# Patient Record
Sex: Male | Born: 1995 | Hispanic: No | Marital: Single | Smoking: Never smoker
Health system: Southern US, Community
[De-identification: ages and names within clinical notes are randomized; demographics above are authoritative.]

## PROBLEM LIST (undated history)

## (undated) DIAGNOSIS — F840 Autistic disorder: Secondary | ICD-10-CM

---
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
## 2020-10-12 ENCOUNTER — Other Ambulatory Visit: Payer: Self-pay

## 2020-10-12 ENCOUNTER — Encounter (HOSPITAL_COMMUNITY): Payer: Self-pay | Admitting: Emergency Medicine

## 2020-10-12 ENCOUNTER — Emergency Department (HOSPITAL_COMMUNITY)
Admission: EM | Admit: 2020-10-12 | Discharge: 2020-10-12 | Disposition: A | Discharge status: Home / Self Care | Payer: Medicaid Other | Attending: Emergency Medicine | Admitting: Emergency Medicine

## 2020-10-12 DIAGNOSIS — R0602 Shortness of breath: Secondary | ICD-10-CM | POA: Insufficient documentation

## 2020-10-12 DIAGNOSIS — F840 Autistic disorder: Secondary | ICD-10-CM | POA: Diagnosis not present

## 2020-10-12 DIAGNOSIS — T782XXA Anaphylactic shock, unspecified, initial encounter: Secondary | ICD-10-CM

## 2020-10-12 DIAGNOSIS — R000 Tachycardia, unspecified: Secondary | ICD-10-CM | POA: Diagnosis not present

## 2020-10-12 DIAGNOSIS — Z20822 Contact with and (suspected) exposure to covid-19: Secondary | ICD-10-CM | POA: Insufficient documentation

## 2020-10-12 HISTORY — DX: Autistic disorder: F84.0

## 2020-10-12 LAB — BASIC METABOLIC PANEL
Anion gap: 10 (ref 5–15)
BUN: 16 mg/dL (ref 6–20)
CO2: 22 mmol/L (ref 22–32)
Calcium: 9.5 mg/dL (ref 8.9–10.3)
Chloride: 106 mmol/L (ref 98–111)
Creatinine, Ser: 0.97 mg/dL (ref 0.61–1.24)
GFR, Estimated: >60 mL/min (ref >60)
Glucose, Bld: 119 mg/dL — ABNORMAL HIGH (ref 70–99)
Potassium: 3.6 mmol/L (ref 3.5–5.1)
Sodium: 138 mmol/L (ref 135–145)

## 2020-10-12 LAB — CBG MONITORING, ED: Glucose-Capillary: 129 mg/dL — ABNORMAL HIGH (ref 70–99)

## 2020-10-12 LAB — CBC WITH DIFFERENTIAL/PLATELET
Abs Immature Granulocytes: 0.14 10*3/uL — ABNORMAL HIGH (ref 0.00–0.07)
Basophils Absolute: 0.1 10*3/uL (ref 0.0–0.1)
Basophils Relative: 0 %
Eosinophils Absolute: 0 10*3/uL (ref 0.0–0.5)
Eosinophils Relative: 0 %
HCT: 50.6 % (ref 39.0–52.0)
Hemoglobin: 16.6 g/dL (ref 13.0–17.0)
Immature Granulocytes: 1 %
Lymphocytes Relative: 15 %
Lymphs Abs: 3.5 10*3/uL (ref 0.7–4.0)
MCH: 29.2 pg (ref 26.0–34.0)
MCHC: 32.8 g/dL (ref 30.0–36.0)
MCV: 89.1 fL (ref 80.0–100.0)
Monocytes Absolute: 1.4 10*3/uL — ABNORMAL HIGH (ref 0.1–1.0)
Monocytes Relative: 6 %
Neutro Abs: 18 10*3/uL — ABNORMAL HIGH (ref 1.7–7.7)
Neutrophils Relative %: 78 %
Platelets: 356 10*3/uL (ref 150–400)
RBC: 5.68 MIL/uL (ref 4.22–5.81)
RDW: 13.1 % (ref 11.5–15.5)
WBC: 23.1 10*3/uL — ABNORMAL HIGH (ref 4.0–10.5)
nRBC: 0 % (ref 0.0–0.2)

## 2020-10-12 LAB — I-STAT CHEM 8, ED
BUN: 19 mg/dL (ref 6–20)
Calcium, Ion: 1.21 mmol/L (ref 1.15–1.40)
Chloride: 105 mmol/L (ref 98–111)
Creatinine, Ser: 0.8 mg/dL (ref 0.61–1.24)
Glucose, Bld: 118 mg/dL — ABNORMAL HIGH (ref 70–99)
HCT: 52 % (ref 39.0–52.0)
Hemoglobin: 17.7 g/dL — ABNORMAL HIGH (ref 13.0–17.0)
Potassium: 3.5 mmol/L (ref 3.5–5.1)
Sodium: 141 mmol/L (ref 135–145)
TCO2: 25 mmol/L (ref 22–32)

## 2020-10-12 LAB — RESPIRATORY PANEL BY RT PCR (FLU A&B, COVID)
Influenza A by PCR: NEGATIVE
Influenza B by PCR: NEGATIVE
SARS Coronavirus 2 by RT PCR: NEGATIVE

## 2020-10-12 MED ORDER — EPINEPHRINE 0.3 MG/0.3ML IJ SOAJ: 0.3000 mg | INTRAMUSCULAR | 1 refills | Status: DC | PRN

## 2020-10-12 MED ORDER — FAMOTIDINE 20 MG PO TABS: 20.0000 mg | ORAL_TABLET | Freq: Two times a day (BID) | ORAL | 0 refills | Status: DC

## 2020-10-12 MED ORDER — LACTATED RINGERS IV BOLUS
1000.0000 mL | Freq: Once | INTRAVENOUS | Status: AC
  Administered 2020-10-12: 1000 mL via INTRAVENOUS

## 2020-10-12 MED ORDER — EPINEPHRINE 0.3 MG/0.3ML IJ SOAJ: 0.3000 mg | Freq: Once | INTRAMUSCULAR | Status: AC

## 2020-10-12 MED ORDER — DEXAMETHASONE SODIUM PHOSPHATE 10 MG/ML IJ SOLN
10.0000 mg | Freq: Once | INTRAMUSCULAR | Status: AC
  Administered 2020-10-12: 10 mg via INTRAVENOUS
  Filled 2020-10-12: qty 1

## 2020-10-12 MED ORDER — EPINEPHRINE 0.3 MG/0.3ML IJ SOAJ
INTRAMUSCULAR | Status: AC
  Administered 2020-10-12: 0.3 mg via INTRAMUSCULAR
  Filled 2020-10-12: qty 0.3

## 2020-10-12 MED ORDER — ONDANSETRON HCL 4 MG/2ML IJ SOLN
4.0000 mg | Freq: Once | INTRAMUSCULAR | Status: AC
  Administered 2020-10-12: 4 mg via INTRAVENOUS
  Filled 2020-10-12: qty 2

## 2020-10-12 MED ORDER — DIPHENHYDRAMINE HCL 50 MG/ML IJ SOLN
25.0000 mg | Freq: Once | INTRAMUSCULAR | Status: AC
  Administered 2020-10-12: 25 mg via INTRAVENOUS
  Filled 2020-10-12: qty 1

## 2020-10-12 MED ORDER — DIPHENHYDRAMINE HCL 25 MG PO TABS: 25.0000 mg | ORAL_TABLET | Freq: Four times a day (QID) | ORAL | 0 refills | Status: DC | PRN

## 2020-10-12 MED ORDER — PREDNISONE 10 MG (21) PO TBPK: ORAL_TABLET | ORAL | 0 refills | Status: DC

## 2020-10-12 MED ORDER — FAMOTIDINE IN NACL 20-0.9 MG/50ML-% IV SOLN
20.0000 mg | Freq: Once | INTRAVENOUS | Status: AC
  Administered 2020-10-12: 20 mg via INTRAVENOUS
  Filled 2020-10-12: qty 50

## 2020-10-12 NOTE — ED Triage Notes (Addendum)
Pt has autism.  Mother reports he was outside mowing and came in vomiting profusely and had a syncopal episode.  Pt has hives all over.  Questioned if he got stung while mowing and he states he was stung on R ankle.  Speaking in complete sentences.  States he does feel SOB.  No angioedema noted.

## 2020-10-12 NOTE — ED Provider Notes (Signed)
[facility] Provider Note   CSN: [id number] Arrival date & time: 10/12/20  1415     History Chief Complaint  Patient presents with  . Anaphalaxis    [facility] is a 24 y.o. male.  HPI      [facility] is a 24 y.o. male, with a history of Autism and obesity, presenting to the ED accompanied by his mother with hives, vomiting, shortness of breath beginning suddenly around 1 PM. Mother states patient was outside mowing the yard, came inside vomiting, had a syncopal episode.  When he awoke, he complained of shortness of breath and she noted a rash on his body. Patient states he still feels some shortness of breath.  He has not vomited since the initial episode of emesis. He states he was stung by a bee on his right ankle.  Patient and his mother deny any previous known reaction to stings. Patient denies current dizziness, headache, chest pain, abdominal pain, facial swelling, mouth swelling, or any other complaints.  Past Medical History:  Diagnosis Date  . Autism     There are no problems to display for this patient.   History reviewed. No pertinent surgical history.     No family history on file.  Social History   Tobacco Use  . Smoking status: Never Smoker  . Smokeless tobacco: Never Used  Substance Use Topics  . Alcohol use: Never  . Drug use: Never    Home Medications Prior to Admission medications   Medication Sig Start Date End Date Taking? Authorizing Provider  diphenhydrAMINE (BENADRYL) 25 MG tablet Take 1 tablet (25 mg total) by mouth every 6 (six) hours as needed for up to 1 day. 10/12/20 10/13/20  Yamaira Spinner C, PA-C  EPINEPHrine 0.3 mg/0.3 mL IJ SOAJ injection Inject 0.3 mg into the muscle as needed for anaphylaxis. 10/12/20   Salihah Peckham C, PA-C  famotidine (PEPCID) 20 MG tablet Take 1 tablet (20 mg total) by mouth 2 (two) times daily for 3 days. 10/12/20 10/15/20  Lanaya Bennis C, PA-C  predniSONE (STERAPRED  UNI-PAK 21 TAB) 10 MG (21) TBPK tablet Take 6 tabs (60mg ) day 1, 5 tabs (50mg ) day 2, 4 tabs (40mg ) day 3, 3 tabs (30mg ) day 4, 2 tabs (20mg ) day 5, and 1 tab (10mg ) day 6. 10/12/20   Queenie Aufiero C, PA-C    Allergies    Bee venom  Review of Systems   Review of Systems  Constitutional: Positive for diaphoresis.  HENT: Negative for drooling, facial swelling and trouble swallowing.   Respiratory: Positive for shortness of breath.   Cardiovascular: Negative for chest pain.  Gastrointestinal: Positive for nausea and vomiting. Negative for abdominal pain and diarrhea.  Skin: Positive for rash.  Neurological: Positive for syncope.  All other systems reviewed and are negative.   Physical Exam Updated Vital Signs BP (!) 107/91 (BP Location: Right Arm)   Pulse 80   Temp 97.8 F (36.6 C) (Oral)   Resp 15   SpO2 99%   Physical Exam Vitals and nursing note reviewed.  Constitutional:      General: He is not in acute distress.    Appearance: He is well-developed. He is not diaphoretic.  HENT:     Head: Normocephalic and atraumatic.     Mouth/Throat:     Mouth: Mucous membranes are moist.     Pharynx: Oropharynx is clear.     Comments: No noted facial, perioral, or intraoral swelling or  lesions. No phonation abnormality.  Handles oral secretions without difficulty. Eyes:     Conjunctiva/sclera: Conjunctivae normal.  Cardiovascular:     Rate and Rhythm: Regular rhythm. Tachycardia present.     Pulses: Normal pulses.          Radial pulses are 2+ on the right side and 2+ on the left side.       Posterior tibial pulses are 2+ on the right side and 2+ on the left side.     Heart sounds: Normal heart sounds.     Comments: Tactile temperature in the extremities appropriate and equal bilaterally. Pulmonary:     Effort: Pulmonary effort is normal. No respiratory distress.     Breath sounds: Normal breath sounds.  Abdominal:     Palpations: Abdomen is soft.     Tenderness: There is no  abdominal tenderness. There is no guarding.  Musculoskeletal:     Cervical back: Neck supple.     Right lower leg: No edema.     Left lower leg: No edema.  Lymphadenopathy:     Cervical: No cervical adenopathy.  Skin:    General: Skin is warm and dry.     Findings: Rash present. Rash is urticarial.  Neurological:     Mental Status: He is alert.  Psychiatric:        Mood and Affect: Mood and affect normal.        Speech: Speech normal.        Behavior: Behavior normal.     ED Results / Procedures / Treatments   Labs (all labs ordered are listed, but only abnormal results are displayed) Labs Reviewed  CBC WITH DIFFERENTIAL/PLATELET - Abnormal; Notable for the following components:      Result Value   WBC 23.1 (*)    Neutro Abs 18.0 (*)    Monocytes Absolute 1.4 (*)    Abs Immature Granulocytes 0.14 (*)    All other components within normal limits  BASIC METABOLIC PANEL - Abnormal; Notable for the following components:   Glucose, Bld 119 (*)    All other components within normal limits  CBG MONITORING, ED - Abnormal; Notable for the following components:   Glucose-Capillary 129 (*)    All other components within normal limits  I-STAT CHEM 8, ED - Abnormal; Notable for the following components:   Glucose, Bld 118 (*)    Hemoglobin 17.7 (*)    All other components within normal limits  RESPIRATORY PANEL BY RT PCR (FLU A&B, COVID)    EKG None  Radiology No results found.  Procedures .Critical Care Performed by: Anselm Pancoast, PA-C Authorized by: Anselm Pancoast, PA-C   Critical care provider statement:    Critical care time (minutes):  35   Critical care time was exclusive of:  Separately billable procedures and treating other patients   Critical care was necessary to treat or prevent imminent or life-threatening deterioration of the following conditions: Anaphylactic reaction.   Critical care was time spent personally by me on the following activities:  Ordering and  performing treatments and interventions, ordering and review of laboratory studies, ordering and review of radiographic studies, re-evaluation of patient's condition, obtaining history from patient or surrogate, evaluation of patient's response to treatment, development of treatment plan with patient or surrogate and examination of patient   I assumed direction of critical care for this patient from another provider in my specialty: no     (including critical care time)  Medications Ordered in  ED Medications  diphenhydrAMINE (BENADRYL) injection 25 mg (25 mg Intravenous Given 10/12/20 1603)  EPINEPHrine (EPI-PEN) injection 0.3 mg (0.3 mg Intramuscular Given 10/12/20 1555)  dexamethasone (DECADRON) injection 10 mg (10 mg Intravenous Given 10/12/20 1608)  famotidine (PEPCID) IVPB 20 mg premix (0 mg Intravenous Stopped 10/12/20 1630)  ondansetron (ZOFRAN) injection 4 mg (4 mg Intravenous Given 10/12/20 1606)  lactated ringers bolus 1,000 mL (0 mLs Intravenous Stopped 10/12/20 1727)    ED Course  I have reviewed the triage vital signs and the nursing notes.  Pertinent labs & imaging results that were available during my care of the patient were reviewed by me and considered in my medical decision making (see chart for details).  Clinical Course as of Oct 13 1847  Sat Oct 12, 2020  1552 Epi   [SJ]  1602 Patient reassessed.  States his shortness of breath has improved.  Lung sounds remain clear.  Hives are also beginning to clear.  No longer nauseous or vomiting.   [SJ]  1645 Patient again reassessed.  Denies any recurrence of his symptoms.   [SJ]    Clinical Course User Index [SJ] TRUE Shackleford, Hillard Danker, PA-C   MDM Rules/Calculators/A&P                          Patient presents with evidence of allergic reaction.   Patient initially evaluated in triage/waiting room.  Noted to have hives at that time, but no other symptom involvement was noted.  However, patient began to vomit when he came back  to a room.  Treatment for anaphylaxis was initiated.  No evidence of hypotension or altered mental status. Patient symptoms resolved during ED course without recurrence. He continued to be symptom-free upon my evaluation just prior to discharge. I personally reviewed and interpreted the patient's lab work.  Patient and his mother were given instructions for home care as well as return precautions.  Both parties voice understanding of these instructions, accept the plan, and are comfortable with discharge.  Findings and plan of care discussed with Adalberto Cole, MD. Dr. Jeraldine Loots personally evaluated and examined this patient.  Vitals:   10/12/20 1745 10/12/20 1800 10/12/20 1815 10/12/20 1848  BP: 117/65 123/67 107/69 118/74  Pulse: 96 92 100 91  Resp: (!) 31 (!) 32 (!) 22 20  Temp:      TempSrc:      SpO2: 93% 94% 96% 96%     Final Clinical Impression(s) / ED Diagnoses Final diagnoses:  Anaphylaxis, initial encounter    Rx / DC Orders ED Discharge Orders         Ordered    diphenhydrAMINE (BENADRYL) 25 MG tablet  Every 6 hours PRN        10/12/20 1845    famotidine (PEPCID) 20 MG tablet  2 times daily        10/12/20 1845    EPINEPHrine 0.3 mg/0.3 mL IJ SOAJ injection  As needed        10/12/20 1845    predniSONE (STERAPRED UNI-PAK 21 TAB) 10 MG (21) TBPK tablet        10/12/20 1845           Anselm Pancoast, PA-C 10/12/20 1850    Gerhard Munch, MD 10/13/20 2317

## 2020-10-12 NOTE — ED Notes (Signed)
Beverage and snack given to patient.

## 2020-10-12 NOTE — Discharge Instructions (Addendum)
Allergic Reaction Instructions:  Benadryl: Take 25 mg of Benadryl every 6 hours for the next 24 hours.  Use caution as Benadryl can make you drowsy. Pepcid: Take the Pepcid, as prescribed, over the next 3 days. Prednisone: Take prednisone, as prescribed, until finished. EpiPen: You have been prescribed an EpiPen to be used in the case of anaphylaxis. Please see the attached sheets regarding the symptoms that would cause you to have to use the EpiPen. If you have to use the EpiPen, you should always come to the emergency room immediately.  Follow-up with your primary care provider on this matter.  Allergy testing with an allergist may be warranted.  Return to the ED for worsening symptoms, shortness of breath, chest pain, palpitations, persistent vomiting, facial or throat swelling, or any other major concerns.  For prescription assistance, may try using prescription discount sites or apps, such as goodrx.com or Good Rx smart phone app. 

## 2021-11-02 NOTE — Progress Notes (Signed)
Virtual Visit via Telephone Note  I connected with [facility], on 11/05/2021 at 3:21 PM by telephone due to the COVID-19 pandemic and verified that I am speaking with the correct person using two identifiers.  Due to current restrictions/limitations of in-office visits due to the COVID-19 pandemic, this scheduled clinical appointment was converted to a telehealth visit.   Consent: I discussed the limitations, risks, security and privacy concerns of performing an evaluation and management service by telephone and the availability of in person appointments. I also discussed with the patient that there may be a patient responsible charge related to this service. The patient expressed understanding and agreed to proceed.   Location of Patient: Home  Location of Provider: [facility]   Persons participating in Telemedicine visit: Suella Grove, NP Margorie John, CMA   History of Present Illness: [facility] is a 25 year-old male who presents to establish care. He is accompanied by his mother, [name], who serves as primary historian.  Current issues and/or concerns: Would like annual physical exam soon. Reports intermittent lower back pain for months. Taking Ibuprofen helps. Denies any recent trauma/injury.    Past Medical History:  Diagnosis Date   Autism    Allergies  Allergen Reactions   Bee Venom Anaphylaxis    No current outpatient medications on file prior to visit.   No current facility-administered medications on file prior to visit.    Observations/Objective: Alert and oriented x 3. Not in acute distress. Physical examination not completed as this is a telemedicine visit.  Assessment and Plan: 1. Encounter to establish care: - Patient presents today to establish care.  - Return for annual physical examination, labs, and health maintenance. Arrive fasting meaning having no food for at least 8 hours prior to  appointment. You may have only water or black coffee. Please take scheduled medications as normal.  2. Chronic bilateral low back pain, unspecified whether sciatica present: - Follow-up with primary provider as scheduled.   Follow Up Instructions: Return for annual physical exam.   Patient was given clear instructions to go to Emergency Department or return to medical center if symptoms don't improve, worsen, or new problems develop.The patient verbalized understanding.  I discussed the assessment and treatment plan with the patient. The patient was provided an opportunity to ask questions and all were answered. The patient agreed with the plan and demonstrated an understanding of the instructions.   The patient was advised to call back or seek an in-person evaluation if the symptoms worsen or if the condition fails to improve as anticipated.   I provided 10 minutes total of non-face-to-face time during this encounter.   Keghan Mcfarren Jodi Geralds, NP  [facility] [place], [place] [phone] 11/05/2021, 3:21 PM

## 2021-11-05 ENCOUNTER — Encounter: Payer: Self-pay | Admitting: Family

## 2021-11-05 ENCOUNTER — Ambulatory Visit (INDEPENDENT_AMBULATORY_CARE_PROVIDER_SITE_OTHER): Payer: Medicaid Other | Admitting: Family

## 2021-11-05 DIAGNOSIS — M545 Low back pain, unspecified: Secondary | ICD-10-CM | POA: Diagnosis not present

## 2021-11-05 DIAGNOSIS — G8929 Other chronic pain: Secondary | ICD-10-CM

## 2021-11-05 DIAGNOSIS — Z7689 Persons encountering health services in other specified circumstances: Secondary | ICD-10-CM

## 2021-11-05 NOTE — Progress Notes (Signed)
Pt presents for telemedicine visit being completed by mother Boyd Kerbs, mother states that pt is autistic and hasn't been to doctor needs physical and blood work

## 2021-12-07 NOTE — Progress Notes (Signed)
Patient ID: MARCHELLO ROTHGEB, male    DOB: 10-22-1996  MRN: [id number]  CC: Annual Physical Exam   Subjective: [facility] is a 25 y.o. male who presents for annual physical exam. He is accompanied by his mother, [name].   His concerns today include:  None   There are no problems to display for this patient.    No current outpatient medications on file prior to visit.   No current facility-administered medications on file prior to visit.    Allergies  Allergen Reactions   Bee Venom Anaphylaxis    Social History   Socioeconomic History   Marital status: Single    Spouse name: Not on file   Number of children: Not on file   Years of education: Not on file   Highest education level: Not on file  Occupational History   Not on file  Tobacco Use   Smoking status: Never   Smokeless tobacco: Never  Vaping Use   Vaping Use: Never used  Substance and Sexual Activity   Alcohol use: Never   Drug use: Never   Sexual activity: Not on file  Other Topics Concern   Not on file  Social History Narrative   Not on file   Social Determinants of Health   Financial Resource Strain: Not on file  Food Insecurity: Not on file  Transportation Needs: Not on file  Physical Activity: Not on file  Stress: Not on file  Social Connections: Not on file  Intimate Partner Violence: Not on file    Family History  Problem Relation Age of Onset   Diabetes Mother    Clotting disorder Mother    Hypertension Mother     History reviewed. No pertinent surgical history.  ROS: Review of Systems Negative except as stated above  PHYSICAL EXAM: BP 136/78 (BP Location: Left Arm, Patient Position: Sitting, Cuff Size: Normal)   Pulse 74   Temp 98.3 F (36.8 C)   Resp 18   Ht 5' 8.74" (1.746 m)   Wt 269 lb 12.8 oz (122.4 kg)   SpO2 98%   BMI 40.14 kg/m   Physical Exam HENT:     Head: Normocephalic and atraumatic.     Right Ear: Tympanic membrane, ear canal and external ear normal.      Left Ear: Tympanic membrane, ear canal and external ear normal.     Nose: Nose normal.     Mouth/Throat:     Mouth: Mucous membranes are moist.     Pharynx: Oropharynx is clear.  Eyes:     Extraocular Movements: Extraocular movements intact.     Conjunctiva/sclera: Conjunctivae normal.     Pupils: Pupils are equal, round, and reactive to light.  Cardiovascular:     Rate and Rhythm: Normal rate and regular rhythm.     Pulses: Normal pulses.     Heart sounds: Normal heart sounds.  Pulmonary:     Effort: Pulmonary effort is normal.     Breath sounds: Normal breath sounds.  Abdominal:     General: Bowel sounds are normal. There is no distension.     Palpations: Abdomen is soft.  Genitourinary:    Comments: Patient declined exam.  Musculoskeletal:        General: Normal range of motion.     Cervical back: Normal range of motion and neck supple.  Skin:    General: Skin is warm and dry.     Capillary Refill: Capillary refill takes less than 2 seconds.  Neurological:     General: No focal deficit present.     Mental Status: He is alert and oriented to person, place, and time.  Psychiatric:        Mood and Affect: Mood normal.        Behavior: Behavior normal.    ASSESSMENT AND PLAN: 1. Annual physical exam - Counseled on 150 minutes of exercise per week as tolerated, healthy eating (including decreased daily intake of saturated fats, cholesterol, added sugars, sodium), STI prevention, and routine healthcare maintenance.  2. Screening for metabolic disorder: - OYW31+UCJA to check kidney function, liver function, and electrolyte balance.  - CMP14+EGFR  3. Screening for deficiency anemia: - CBC to screen for anemia. - CBC  4. Diabetes mellitus screening: - Hemoglobin A1c to screen for pre-diabetes/diabetes. - Hemoglobin A1c  5. Screening cholesterol level: - Lipid panel to screen for high cholesterol.  - Lipid panel  6. Thyroid disorder screen: - TSH to check thyroid  function.  - TSH  7. Need for hepatitis C screening test: - Hepatitis C antibody to screen for hepatitis C.  - Hepatitis C Antibody  8. Encounter for screening for HIV: - HIV antibody to screen for human immunodeficiency virus.  - HIV antibody (with reflex)  9. Need for Tdap vaccination: - Administered today in office.  - Tdap vaccine greater than or equal to 7yo IM  10. Need for HPV vaccination: - Administered today in office.  - HPV 9-valent vaccine,Recombinat    Patient was given the opportunity to ask questions.  Patient verbalized understanding of the plan and was able to repeat key elements of the plan. Patient was given clear instructions to go to Emergency Department or return to medical center if symptoms don't improve, worsen, or new problems develop.The patient verbalized understanding.   Orders Placed This Encounter  Procedures   HPV 9-valent vaccine,Recombinat   Tdap vaccine greater than or equal to 7yo IM   HIV antibody (with reflex)   Hepatitis C Antibody   CBC   Lipid panel   CMP14+EGFR   Hemoglobin A1c   TSH   Follow-up with primary provider as scheduled.  Camillia Herter, NP

## 2021-12-10 ENCOUNTER — Ambulatory Visit (INDEPENDENT_AMBULATORY_CARE_PROVIDER_SITE_OTHER): Payer: Medicaid Other | Admitting: Family

## 2021-12-10 ENCOUNTER — Encounter (INDEPENDENT_AMBULATORY_CARE_PROVIDER_SITE_OTHER): Payer: Self-pay

## 2021-12-10 ENCOUNTER — Other Ambulatory Visit: Payer: Self-pay

## 2021-12-10 ENCOUNTER — Encounter: Payer: Self-pay | Admitting: Family

## 2021-12-10 VITALS — BP 136/78 | HR 74 | Temp 98.3°F | Resp 18 | Ht 68.74 in | Wt 269.8 lb

## 2021-12-10 DIAGNOSIS — Z23 Encounter for immunization: Secondary | ICD-10-CM | POA: Diagnosis not present

## 2021-12-10 DIAGNOSIS — Z131 Encounter for screening for diabetes mellitus: Secondary | ICD-10-CM

## 2021-12-10 DIAGNOSIS — Z0000 Encounter for general adult medical examination without abnormal findings: Secondary | ICD-10-CM | POA: Diagnosis not present

## 2021-12-10 DIAGNOSIS — Z130 Encounter for screening for diseases of the blood and blood-forming organs and certain disorders involving the immune mechanism: Secondary | ICD-10-CM

## 2021-12-10 DIAGNOSIS — Z114 Encounter for screening for human immunodeficiency virus [HIV]: Secondary | ICD-10-CM

## 2021-12-10 DIAGNOSIS — Z13228 Encounter for screening for other metabolic disorders: Secondary | ICD-10-CM

## 2021-12-10 DIAGNOSIS — Z1159 Encounter for screening for other viral diseases: Secondary | ICD-10-CM

## 2021-12-10 DIAGNOSIS — Z13220 Encounter for screening for lipoid disorders: Secondary | ICD-10-CM

## 2021-12-10 DIAGNOSIS — Z1329 Encounter for screening for other suspected endocrine disorder: Secondary | ICD-10-CM

## 2021-12-10 NOTE — Patient Instructions (Signed)
Preventive Care 21-26 Years Old, Male ?Preventive care refers to lifestyle choices and visits with your health care provider that can promote health and wellness. Preventive care visits are also called wellness exams. ?What can I expect for my preventive care visit? ?Counseling ?During your preventive care visit, your health care provider may ask about your: ?Medical history, including: ?Past medical problems. ?Family medical history. ?Current health, including: ?Emotional well-being. ?Home life and relationship well-being. ?Sexual activity. ?Lifestyle, including: ?Alcohol, nicotine or tobacco, and drug use. ?Access to firearms. ?Diet, exercise, and sleep habits. ?Safety issues such as seatbelt and bike helmet use. ?Sunscreen use. ?Work and work environment. ?Physical exam ?Your health care provider may check your: ?Height and weight. These may be used to calculate your BMI (body mass index). BMI is a measurement that tells if you are at a healthy weight. ?Waist circumference. This measures the distance around your waistline. This measurement also tells if you are at a healthy weight and may help predict your risk of certain diseases, such as type 2 diabetes and high blood pressure. ?Heart rate and blood pressure. ?Body temperature. ?Skin for abnormal spots. ?What immunizations do I need? ?Vaccines are usually given at various ages, according to a schedule. Your health care provider will recommend vaccines for you based on your age, medical history, and lifestyle or other factors, such as travel or where you work. ?What tests do I need? ?Screening ?Your health care provider may recommend screening tests for certain conditions. This may include: ?Lipid and cholesterol levels. ?Diabetes screening. This is done by checking your blood sugar (glucose) after you have not eaten for a while (fasting). ?Hepatitis B test. ?Hepatitis C test. ?HIV (human immunodeficiency virus) test. ?STI (sexually transmitted infection)  testing, if you are at risk. ?Talk with your health care provider about your test results, treatment options, and if necessary, the need for more tests. ?Follow these instructions at home: ?Eating and drinking ? ?Eat a healthy diet that includes fresh fruits and vegetables, whole grains, lean protein, and low-fat dairy products. ?Drink enough fluid to keep your urine pale yellow. ?Take vitamin and mineral supplements as recommended by your health care provider. ?Do not drink alcohol if your health care provider tells you not to drink. ?If you drink alcohol: ?Limit how much you have to 0-2 drinks a day. ?Know how much alcohol is in your drink. In the [place] one drink equals one 12 oz bottle of beer (355 mL), one 5 oz glass of wine (148 mL), or one 1? oz glass of hard liquor (44 mL). ?Lifestyle ?Brush your teeth every morning and night with fluoride toothpaste. Floss one time each day. ?Exercise for at least 30 minutes 5 or more days each week. ?Do not use any products that contain nicotine or tobacco. These products include cigarettes, chewing tobacco, and vaping devices, such as e-cigarettes. If you need help quitting, ask your health care provider. ?Do not use drugs. ?If you are sexually active, practice safe sex. Use a condom or other form of protection to prevent STIs. ?Find healthy ways to manage stress, such as: ?Meditation, yoga, or listening to music. ?Journaling. ?Talking to a trusted person. ?Spending time with friends and family. ?Minimize exposure to UV radiation to reduce your risk of skin cancer. ?Safety ?Always wear your seat belt while driving or riding in a vehicle. ?Do not drive: ?If you have been drinking alcohol. Do not ride with someone who has been drinking. ?If you have been using any mind-altering substances or   drugs. ?While texting. ?When you are tired or distracted. ?Wear a helmet and other protective equipment during sports activities. ?If you have firearms in your house, make sure you  follow all gun safety procedures. ?Seek help if you have been physically or sexually abused. ?What's next? ?Go to your health care provider once a year for an annual wellness visit. ?Ask your health care provider how often you should have your eyes and teeth checked. ?Stay up to date on all vaccines. ?This information is not intended to replace advice given to you by your health care provider. Make sure you discuss any questions you have with your health care provider. ?Document Revised: 06/11/2021 Document Reviewed: 06/11/2021 ?Elsevier Patient Education ? 2022 [employer] ? ?

## 2021-12-10 NOTE — Progress Notes (Signed)
Pt presents for annual physical accompanied by mother Boyd Kerbs

## 2021-12-11 LAB — CBC
Hematocrit: 47.8 % (ref 37.5–51.0)
Hemoglobin: 16.1 g/dL (ref 13.0–17.7)
MCH: 29 pg (ref 26.6–33.0)
MCHC: 33.7 g/dL (ref 31.5–35.7)
MCV: 86 fL (ref 79–97)
Platelets: 217 10*3/uL (ref 150–450)
RBC: 5.55 x10E6/uL (ref 4.14–5.80)
RDW: 12.5 % (ref 11.6–15.4)
WBC: 9.1 10*3/uL (ref 3.4–10.8)

## 2021-12-11 LAB — LIPID PANEL
Chol/HDL Ratio: 5.2 ratio — ABNORMAL HIGH (ref 0.0–5.0)
Cholesterol, Total: 192 mg/dL (ref 100–199)
HDL: 37 mg/dL — ABNORMAL LOW (ref >39)
LDL Chol Calc (NIH): 115 mg/dL — ABNORMAL HIGH (ref 0–99)
Triglycerides: 228 mg/dL — ABNORMAL HIGH (ref 0–149)
VLDL Cholesterol Cal: 40 mg/dL (ref 5–40)

## 2021-12-11 LAB — CMP14+EGFR
ALT: 69 IU/L — ABNORMAL HIGH (ref 0–44)
AST: 33 IU/L (ref 0–40)
Albumin/Globulin Ratio: 1.7 (ref 1.2–2.2)
Albumin: 4.6 g/dL (ref 4.1–5.2)
Alkaline Phosphatase: 86 IU/L (ref 44–121)
BUN/Creatinine Ratio: 16 (ref 9–20)
BUN: 11 mg/dL (ref 6–20)
Bilirubin Total: 0.4 mg/dL (ref 0.0–1.2)
CO2: 23 mmol/L (ref 20–29)
Calcium: 9.9 mg/dL (ref 8.7–10.2)
Chloride: 101 mmol/L (ref 96–106)
Creatinine, Ser: 0.7 mg/dL — ABNORMAL LOW (ref 0.76–1.27)
Globulin, Total: 2.7 g/dL (ref 1.5–4.5)
Glucose: 80 mg/dL (ref 70–99)
Potassium: 4.4 mmol/L (ref 3.5–5.2)
Sodium: 140 mmol/L (ref 134–144)
Total Protein: 7.3 g/dL (ref 6.0–8.5)
eGFR: 130 mL/min/{1.73_m2} (ref >59)

## 2021-12-11 LAB — HEPATITIS C ANTIBODY: Hep C Virus Ab: <0.1 s/co ratio (ref 0.0–0.9)

## 2021-12-11 LAB — HEMOGLOBIN A1C
Est. average glucose Bld gHb Est-mCnc: 120 mg/dL
Hgb A1c MFr Bld: 5.8 % — ABNORMAL HIGH (ref 4.8–5.6)

## 2021-12-11 LAB — HIV ANTIBODY (ROUTINE TESTING W REFLEX): HIV Screen 4th Generation wRfx: NONREACTIVE

## 2021-12-11 LAB — TSH: TSH: 2.04 u[IU]/mL (ref 0.450–4.500)

## 2021-12-11 NOTE — Progress Notes (Signed)
Please call patient with update.   Kidney function normal.   Thyroid function normal.   No anemia.   Hepatitis C negative.   HIV negative.   Liver function higher than normal. Please schedule lab only appointment to have rechecked in 4 to 6 weeks. If using any over-the-counter Ibuprofen, Motrin, Aleve, or Naproxen limit use. Also, if consuming alcohol limit use. Practice low-fat diet.   Cholesterol higher than expected. High cholesterol may increase risk of heart attack and/or stroke. Consider eating more fruits, vegetables, and lean baked meats such as chicken or fish. Moderate intensity exercise at least 150 minutes as tolerated per week may help as well. No medication needed as of present. Encouraged to recheck fasting cholesterol in 3  months.   Hemoglobin A1c is consistent with pre-diabetes. Practice healthy eating habits of fresh fruit and vegetables, lean baked meats such as chicken, fish, and [place]; limit breads, rice, pastas, and desserts; practice regular aerobic exercise (at least 150 minutes a week as tolerated). No medication needed as of present. Encouraged to recheck in 6 months.

## 2022-02-15 ENCOUNTER — Encounter: Payer: Self-pay | Admitting: Physician Assistant

## 2022-02-15 ENCOUNTER — Other Ambulatory Visit: Payer: Self-pay

## 2022-02-15 ENCOUNTER — Ambulatory Visit
Admission: EM | Admit: 2022-02-15 | Discharge: 2022-02-15 | Disposition: A | Discharge status: Home / Self Care | Payer: Medicaid Other | Attending: Physician Assistant | Admitting: Physician Assistant

## 2022-02-15 DIAGNOSIS — B349 Viral infection, unspecified: Secondary | ICD-10-CM

## 2022-02-15 LAB — POCT INFLUENZA A/B
Influenza A, POC: NEGATIVE
Influenza B, POC: NEGATIVE

## 2022-02-15 MED ORDER — ONDANSETRON 4 MG PO TBDP: 4.0000 mg | ORAL_TABLET | Freq: Three times a day (TID) | ORAL | 0 refills | Status: DC | PRN

## 2022-02-15 NOTE — ED Triage Notes (Signed)
Pt here c/o vomiting and fever x 3 days; fever reduced with meds; per mother some cough also

## 2022-02-15 NOTE — ED Provider Notes (Signed)
[facility]    CSN: [id number] Arrival date & time: 02/15/22  1151      History   Chief Complaint Chief Complaint  Patient presents with   Fever   Emesis    HPI Jack Juarez is a 26 y.o. male.   Patient here today for evaluation of vomiting and fever that he has had the last 3 days.  Per mother he has not wanted to eat much the last 2 days.  He has not had any diarrhea.  His fever has improved with medication.  He has had some mild cough and minimal sore throat.  The history is provided by the patient.  Fever Associated symptoms: congestion, cough, nausea, sore throat (mild) and vomiting   Associated symptoms: no diarrhea and no ear pain   Emesis Associated symptoms: cough, fever and sore throat (mild)   Associated symptoms: no abdominal pain and no diarrhea    Past Medical History:  Diagnosis Date   Autism     There are no problems to display for this patient.   History reviewed. No pertinent surgical history.     Home Medications    Prior to Admission medications   Medication Sig Start Date End Date Taking? Authorizing Provider  ondansetron (ZOFRAN-ODT) 4 MG disintegrating tablet Take 1 tablet (4 mg total) by mouth every 8 (eight) hours as needed. 02/15/22  Yes Francene Finders, PA-C    Family History Family History  Problem Relation Age of Onset   Diabetes Mother    Clotting disorder Mother    Hypertension Mother     Social History Social History   Tobacco Use   Smoking status: Never   Smokeless tobacco: Never  Vaping Use   Vaping Use: Never used  Substance Use Topics   Alcohol use: Never   Drug use: Never     Allergies   Bee venom   Review of Systems Review of Systems  Constitutional:  Positive for fever.  HENT:  Positive for congestion and sore throat (mild). Negative for ear pain.   Eyes:  Negative for discharge and redness.  Respiratory:  Positive for cough. Negative for shortness of breath.   Gastrointestinal:   Positive for nausea and vomiting. Negative for abdominal pain and diarrhea.    Physical Exam Triage Vital Signs ED Triage Vitals [02/15/22 1334]  Enc Vitals Group     BP 134/80     Pulse Rate (!) 104     Resp 18     Temp 99.1 F (37.3 C)     Temp Source Oral     SpO2 95 %     Weight      Height      Head Circumference      Peak Flow      Pain Score 5     Pain Loc      Pain Edu?      Excl. in [place]?    No data found.  Updated Vital Signs BP 134/80 (BP Location: Left Arm)    Pulse (!) 104    Temp 99.1 F (37.3 C) (Oral)    Resp 18    SpO2 95%      Physical Exam Vitals and nursing note reviewed.  Constitutional:      General: He is not in acute distress.    Appearance: Normal appearance. He is not ill-appearing.  HENT:     Head: Normocephalic and atraumatic.     Nose: Nose normal. No congestion.  Mouth/Throat:     Mouth: Mucous membranes are moist.     Pharynx: Oropharynx is clear. No oropharyngeal exudate or posterior oropharyngeal erythema.  Eyes:     Conjunctiva/sclera: Conjunctivae normal.  Cardiovascular:     Rate and Rhythm: Normal rate and regular rhythm.     Heart sounds: Normal heart sounds. No murmur heard. Pulmonary:     Effort: Pulmonary effort is normal. No respiratory distress.     Breath sounds: Normal breath sounds. No wheezing, rhonchi or rales.  Skin:    General: Skin is warm and dry.  Neurological:     Mental Status: He is alert.  Psychiatric:        Mood and Affect: Mood normal.        Thought Content: Thought content normal.     UC Treatments / Results  Labs (all labs ordered are listed, but only abnormal results are displayed) Labs Reviewed  NOVEL CORONAVIRUS, NAA  POCT INFLUENZA A/B    EKG   Radiology No results found.  Procedures Procedures (including critical care time)  Medications Ordered in UC Medications - No data to display  Initial Impression / Assessment and Plan / UC Course  I have reviewed the triage vital  signs and the nursing notes.  Pertinent labs & imaging results that were available during my care of the patient were reviewed by me and considered in my medical decision making (see chart for details).  Suspect likely viral etiology of symptoms.  Flu test negative in office.  Will screen for COVID and will treat with Zofran for nausea.  Recommended increase fluids and bland diet.  Encouraged follow-up if symptoms do not improve or worsen.   Final Clinical Impressions(s) / UC Diagnoses   Final diagnoses:  Viral illness   Discharge Instructions   None    ED Prescriptions     Medication Sig Dispense Auth. Provider   ondansetron (ZOFRAN-ODT) 4 MG disintegrating tablet Take 1 tablet (4 mg total) by mouth every 8 (eight) hours as needed. 20 tablet Francene Finders, PA-C      PDMP not reviewed this encounter.   Francene Finders, PA-C 02/15/22 1431

## 2022-02-16 LAB — NOVEL CORONAVIRUS, NAA: SARS-CoV-2, NAA: NOT DETECTED

## 2022-02-17 ENCOUNTER — Emergency Department (HOSPITAL_COMMUNITY)
Admission: EM | Admit: 2022-02-17 | Discharge: 2022-02-17 | Disposition: A | Discharge status: Home / Self Care | Payer: Medicaid Other | Attending: Emergency Medicine | Admitting: Emergency Medicine

## 2022-02-17 ENCOUNTER — Emergency Department (HOSPITAL_COMMUNITY): Payer: Medicaid Other

## 2022-02-17 ENCOUNTER — Encounter (HOSPITAL_COMMUNITY): Payer: Self-pay

## 2022-02-17 DIAGNOSIS — R0602 Shortness of breath: Secondary | ICD-10-CM | POA: Insufficient documentation

## 2022-02-17 DIAGNOSIS — Z20822 Contact with and (suspected) exposure to covid-19: Secondary | ICD-10-CM | POA: Insufficient documentation

## 2022-02-17 DIAGNOSIS — R112 Nausea with vomiting, unspecified: Secondary | ICD-10-CM | POA: Diagnosis not present

## 2022-02-17 DIAGNOSIS — R059 Cough, unspecified: Secondary | ICD-10-CM | POA: Insufficient documentation

## 2022-02-17 DIAGNOSIS — R509 Fever, unspecified: Secondary | ICD-10-CM | POA: Diagnosis not present

## 2022-02-17 DIAGNOSIS — R1084 Generalized abdominal pain: Secondary | ICD-10-CM | POA: Insufficient documentation

## 2022-02-17 DIAGNOSIS — R7401 Elevation of levels of liver transaminase levels: Secondary | ICD-10-CM | POA: Insufficient documentation

## 2022-02-17 LAB — URINALYSIS, ROUTINE W REFLEX MICROSCOPIC
Bilirubin Urine: NEGATIVE
Glucose, UA: NEGATIVE mg/dL
Hgb urine dipstick: NEGATIVE
Ketones, ur: NEGATIVE mg/dL
Leukocytes,Ua: NEGATIVE
Nitrite: NEGATIVE
Protein, ur: NEGATIVE mg/dL
Specific Gravity, Urine: 1.02 (ref 1.005–1.030)
pH: 5 (ref 5.0–8.0)

## 2022-02-17 LAB — COMPREHENSIVE METABOLIC PANEL
ALT: 84 U/L — ABNORMAL HIGH (ref 0–44)
AST: 57 U/L — ABNORMAL HIGH (ref 15–41)
Albumin: 3.5 g/dL (ref 3.5–5.0)
Alkaline Phosphatase: 77 U/L (ref 38–126)
Anion gap: 10 (ref 5–15)
BUN: 11 mg/dL (ref 6–20)
CO2: 26 mmol/L (ref 22–32)
Calcium: 8.6 mg/dL — ABNORMAL LOW (ref 8.9–10.3)
Chloride: 103 mmol/L (ref 98–111)
Creatinine, Ser: 0.93 mg/dL (ref 0.61–1.24)
GFR, Estimated: >60 mL/min (ref >60)
Glucose, Bld: 111 mg/dL — ABNORMAL HIGH (ref 70–99)
Potassium: 3.9 mmol/L (ref 3.5–5.1)
Sodium: 139 mmol/L (ref 135–145)
Total Bilirubin: 0.5 mg/dL (ref 0.3–1.2)
Total Protein: 6.8 g/dL (ref 6.5–8.1)

## 2022-02-17 LAB — CBC WITH DIFFERENTIAL/PLATELET
Abs Immature Granulocytes: 0.03 10*3/uL (ref 0.00–0.07)
Basophils Absolute: 0.1 10*3/uL (ref 0.0–0.1)
Basophils Relative: 1 %
Eosinophils Absolute: 0.2 10*3/uL (ref 0.0–0.5)
Eosinophils Relative: 3 %
HCT: 45.3 % (ref 39.0–52.0)
Hemoglobin: 15.5 g/dL (ref 13.0–17.0)
Immature Granulocytes: 0 %
Lymphocytes Relative: 51 %
Lymphs Abs: 3.8 10*3/uL (ref 0.7–4.0)
MCH: 29.4 pg (ref 26.0–34.0)
MCHC: 34.2 g/dL (ref 30.0–36.0)
MCV: 85.8 fL (ref 80.0–100.0)
Monocytes Absolute: 0.4 10*3/uL (ref 0.1–1.0)
Monocytes Relative: 5 %
Neutro Abs: 2.9 10*3/uL (ref 1.7–7.7)
Neutrophils Relative %: 40 %
Platelets: 222 10*3/uL (ref 150–400)
RBC: 5.28 MIL/uL (ref 4.22–5.81)
RDW: 12.9 % (ref 11.5–15.5)
WBC: 7.4 10*3/uL (ref 4.0–10.5)
nRBC: 0 % (ref 0.0–0.2)

## 2022-02-17 LAB — LIPASE, BLOOD: Lipase: 30 U/L (ref 11–51)

## 2022-02-17 LAB — RESP PANEL BY RT-PCR (FLU A&B, COVID) ARPGX2
Influenza A by PCR: NEGATIVE
Influenza B by PCR: NEGATIVE
SARS Coronavirus 2 by RT PCR: NEGATIVE

## 2022-02-17 MED ORDER — ACETAMINOPHEN 325 MG PO TABS
650.0000 mg | ORAL_TABLET | Freq: Four times a day (QID) | ORAL | Status: DC | PRN
  Administered 2022-02-17: 650 mg via ORAL
  Filled 2022-02-17: qty 2

## 2022-02-17 MED ORDER — SODIUM CHLORIDE 0.9 % IV BOLUS
1000.0000 mL | Freq: Once | INTRAVENOUS | Status: AC
  Administered 2022-02-17: 1000 mL via INTRAVENOUS

## 2022-02-17 MED ORDER — IOHEXOL 300 MG/ML  SOLN
100.0000 mL | Freq: Once | INTRAMUSCULAR | Status: AC | PRN
  Administered 2022-02-17: 100 mL via INTRAVENOUS

## 2022-02-17 MED ORDER — DOXYCYCLINE HYCLATE 100 MG PO CAPS
100.0000 mg | ORAL_CAPSULE | Freq: Two times a day (BID) | ORAL | 0 refills | Status: DC
Start: 2022-02-17 — End: 2022-02-23

## 2022-02-17 MED ORDER — ONDANSETRON HCL 4 MG/2ML IJ SOLN
4.0000 mg | Freq: Once | INTRAMUSCULAR | Status: AC
  Administered 2022-02-17: 4 mg via INTRAVENOUS
  Filled 2022-02-17: qty 2

## 2022-02-17 NOTE — ED Notes (Signed)
Patient transported to X-ray 

## 2022-02-17 NOTE — ED Provider Triage Note (Signed)
Emergency Medicine Provider Triage Evaluation Note  Jack Juarez , a 26 y.o. male  was evaluated in triage.  Pt complains of nausea and vomiting since February 16.  He had associated fever at that time.  He was seen by urgent care on February 19.  At that time, patient had been having the symptoms for 3 days with associated fever.  A viral panel was ordered and he was negative for COVID and flu.  Patient's symptoms continue to get worse with nausea, vomiting, and generalized abdominal pain.  He continues to be febrile.  He continues to deny having any diarrhea.  He says the vomit is nonbloody.  He has not been able to tolerate p.o.  He states he has been having regular bowel movements  Review of Systems  Positive: Nausea, vomiting, abdominal pain, fever Negative:   Physical Exam  BP (!) 136/93 (BP Location: Right Arm)    Pulse (!) 119    Temp (!) 103 F (39.4 C) (Oral)    Resp 20    Ht 5\' 9"  (1.753 m)    Wt 122 kg    SpO2 97%    BMI 39.72 kg/m  Gen:   Awake, no distress   Resp:  Normal effort  MSK:   Moves extremities without difficulty  Other:  Generalized abdominal tenderness with no guarding  Medical Decision Making  Medically screening exam initiated at 5:40 PM.  Appropriate orders placed.  COLEY SHEELEY was informed that the remainder of the evaluation will be completed by another provider, this initial triage assessment does not replace that evaluation, and the importance of remaining in the ED until their evaluation is complete.  Patient is tachycardic to 119 and febrile to 103.  He will be given Tylenol in triage.  Will order abdominal labs and CT abdomen pelvis given his having abdominal pain with this and symptoms or not resolving.  He will likely need IV fluids and further evaluation in the back.   Adolphus Birchwood, [place] 02/17/22 1742

## 2022-02-17 NOTE — ED Provider Notes (Signed)
[facility] EMERGENCY DEPARTMENT Provider Note   CSN: [id number] Arrival date & time: 02/17/22  1720     History Chief Complaint  Patient presents with   Shortness of Breath    Jack Juarez is a 26 y.o. male with no significant past medical history who presents to the emergency department with a 5-day history of nausea, vomiting, upper abdominal pain, and cough.  Patient was seen and evaluated at urgent care on 02/15/2022 for similar symptoms.  He was given Zofran at that time.  Flu was negative at that time.  They prescribed him with Zofran to go home with and told to follow-up in the outpatient setting.  Patient is still been vomiting despite taking Zofran.  Patient having persistent fevers.  Fever was up to 103 earlier this morning.   Shortness of Breath     Home Medications Prior to Admission medications   Medication Sig Start Date End Date Taking? Authorizing Provider  doxycycline (VIBRAMYCIN) 100 MG capsule Take 1 capsule (100 mg total) by mouth 2 (two) times daily. 02/17/22  Yes Meredeth Ide, Aquan Kope M, PA-C  ibuprofen (ADVIL) 200 MG tablet Take 600 mg by mouth every 6 (six) hours as needed for headache, fever or mild pain.   Yes [provider]  ondansetron (ZOFRAN-ODT) 4 MG disintegrating tablet Take 1 tablet (4 mg total) by mouth every 8 (eight) hours as needed. Patient taking differently: Take 4 mg by mouth every 8 (eight) hours as needed for nausea or vomiting. 02/15/22  Yes Tomi Bamberger, PA-C      Allergies    Bee venom    Review of Systems   Review of Systems  Respiratory:  Positive for shortness of breath.   All other systems reviewed and are negative.  Physical Exam Updated Vital Signs BP 124/60    Pulse 97    Temp 99.4 F (37.4 C) (Oral)    Resp (!) 21    Ht 5\' 9"  (1.753 m)    Wt 122 kg    SpO2 94%    BMI 39.72 kg/m  Physical Exam Vitals and nursing note reviewed.  Constitutional:      General: He is not in acute distress.     Appearance: Normal appearance.  HENT:     Head: Normocephalic and atraumatic.  Eyes:     General:        Right eye: No discharge.        Left eye: No discharge.  Cardiovascular:     Comments: Tachycardic. S1/S2 are distinct without any evidence of murmur, rubs, or gallops.  Radial pulses are 2+ bilaterally.  Dorsalis pedis pulses are 2+ bilaterally.  No evidence of pedal edema. Pulmonary:     Comments: Clear to auscultation bilaterally.  Normal effort.  No respiratory distress.  No evidence of wheezes, rales, or rhonchi heard throughout. Abdominal:     General: Abdomen is flat. Bowel sounds are normal. There is no distension.     Tenderness: There is abdominal tenderness. There is no guarding or rebound.     Comments: Upper abdominal tenderness.   Musculoskeletal:        General: Normal range of motion.     Cervical back: Neck supple.  Skin:    General: Skin is warm and dry.     Findings: No rash.  Neurological:     General: No focal deficit present.     Mental Status: He is alert.  Psychiatric:  Mood and Affect: Mood normal.        Behavior: Behavior normal.    ED Results / Procedures / Treatments   Labs (all labs ordered are listed, but only abnormal results are displayed) Labs Reviewed  COMPREHENSIVE METABOLIC PANEL - Abnormal; Notable for the following components:      Result Value   Glucose, Bld 111 (*)    Calcium 8.6 (*)    AST 57 (*)    ALT 84 (*)    All other components within normal limits  URINALYSIS, ROUTINE W REFLEX MICROSCOPIC - Abnormal; Notable for the following components:   APPearance HAZY (*)    All other components within normal limits  RESP PANEL BY RT-PCR (FLU A&B, COVID) ARPGX2  CBC WITH DIFFERENTIAL/PLATELET  LIPASE, BLOOD    EKG None  Radiology DG Chest 2 View  Result Date: 02/17/2022 CLINICAL DATA:  Shortness of breath. Additional history provided: Fever, emesis, shortness of breath, headache, malaise. EXAM: CHEST - 2 VIEW  COMPARISON:  Prior chest radiographs 11/12/2016 and earlier. FINDINGS: Shallow inspiration radiograph. Heart size within normal limits. Mild ill-defined opacity within the left lung base appreciated on the PA radiograph only. No appreciable airspace consolidation within the right lung. No evidence of pleural effusion or pneumothorax. No acute bony abnormality identified. IMPRESSION: Shallow inspiration radiograph. Mild ill-defined opacity within the left lung base, appreciated on the PA radiograph only. This is favored to reflect atelectasis. However, early pneumonia is difficult to exclude. Correlate clinically and consider short-interval radiographic follow-up. Electronically Signed   By: Jackey Loge D.O.   On: 02/17/2022 18:30   CT ABDOMEN PELVIS W CONTRAST  Result Date: 02/17/2022 CLINICAL DATA:  Abdominal pain, malaise, fever, emesis EXAM: CT ABDOMEN AND PELVIS WITH CONTRAST TECHNIQUE: Multidetector CT imaging of the abdomen and pelvis was performed using the standard protocol following bolus administration of intravenous contrast. RADIATION DOSE REDUCTION: This exam was performed according to the departmental dose-optimization program which includes automated exposure control, adjustment of the mA and/or kV according to patient size and/or use of iterative reconstruction technique. CONTRAST:  OMNIPAQUE IOHEXOL 300 MG/ML  SOLN COMPARISON:  No prior CT of the abdomen or pelvis. Correlation is made with chest radiograph 02/17/2022. FINDINGS: Lower chest: No focal pulmonary opacity or pleural effusion. No correlate is seen for the suspected opacity noted on the same-day chest radiograph. No pericardial effusion. Hepatobiliary: Hepatic steatosis. No focal hepatic lesion. The hepatic and portal veins are patent. No intra or extrahepatic biliary ductal dilatation. The gallbladder is unremarkable. No pericholecystic fluid or wall thickening. No cholelithiasis. Pancreas: Unremarkable. No pancreatic ductal  dilatation or surrounding inflammatory changes. Spleen: Normal in size without focal abnormality. Adrenals/Urinary Tract: The adrenal glands are unremarkable. The kidneys enhance symmetrically with no hydronephrosis or nephrolithiasis. The bladder is unremarkable for degree of distension. Stomach/Bowel: Stomach is within normal limits. Appendix appears normal. No evidence of bowel wall thickening, distention, or inflammatory changes. Diverticulosis without evidence of diverticulitis. Vascular/Lymphatic: No significant vascular findings are present. No enlarged abdominal or pelvic lymph nodes. Reproductive: Prostate is unremarkable. Other: No abdominal wall hernia or abnormality. No abdominopelvic ascites. Musculoskeletal: Mild S shaped curvature of the thoracolumbar spine. No acute osseous abnormality. 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 IMPRESSION: 1. No acute process in the abdomen or pelvis. 2. No correlate is seen for the opacity noted in the left lung base on the  same-day radiograph, most likely atelectasis. 3. 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 Please correlate with imaging if any intervention is planned. Electronically Signed   By: Wiliam KeAlison  Vasan M.D.   On: 02/17/2022 21:01    Procedures Procedures    Medications Ordered in ED Medications  acetaminophen (TYLENOL) tablet 650 mg (650 mg Oral Given 02/17/22 1737)  sodium chloride 0.9 % bolus 1,000 mL (1,000 mLs Intravenous New Bag/Given 02/17/22 1906)  ondansetron (ZOFRAN) injection 4 mg (4 mg Intravenous Given 02/17/22 1942)  iohexol (OMNIPAQUE) 300 MG/ML solution 100 mL (100 mLs Intravenous Contrast Given 02/17/22 2051)    ED Course/ Medical Decision Making/ A&P Clinical Course  as of 02/17/22 2208  Tue Feb 17, 2022  1946 I discussed this case with my attending physician who cosigned this note including patient's presenting symptoms, physical exam, and planned diagnostics and interventions. Attending physician stated agreement with plan or made changes to plan which were implemented.     [CF]    Clinical Course User Index [CF] Teressa LowerFleming, Quorra Rosene M, PA-C                           Medical Decision Making Risk Prescription drug management.   This patient presents to the ED for concern of upper abdominal pain, fever, nausea, and vomiting, this involves an extensive number of treatment options, and is a complaint that carries with it a high risk of complications and morbidity.  The differential diagnosis includes gastroenteritis, appendicitis, GERD, PUD, pancreatitis, hepatobiliary disease including cholecystitis, COVID, flu.  I have a low suspicion for kidney pathology at this time.  I doubt mesenteric ischemia.   Co morbidities that complicate the patient evaluation  Obesity   Additional history obtained:  Additional history obtained from old records External records from outside source obtained and reviewed including urgent care visit from 02/15/2022.  Patient presented at that time for similar symptoms.  Ultimately discharged with Zofran.   Lab Tests:  I Ordered, and personally interpreted labs.  The pertinent results include: CBC without any evidence of leukocytosis or anemia.  CMP did show some mild transaminitis.  This is likely from vomiting and gastrointestinal upset.  Respiratory panel was negative for COVID and flu.  Lipase is normal.  Urinalysis without signs of infection.    Imaging Studies ordered:  I ordered imaging studies including chest x-ray and CT abdomen pelvis with contrast I independently visualized and interpreted imaging which showed possible atelectasis versus pneumonia.  CT abdomen showed this is likely atelectasis and not  pneumonia.  I agree with the radiologist interpretation   Cardiac Monitoring:  The patient was maintained on a cardiac monitor.  I personally viewed and interpreted the cardiac monitored which showed an underlying rhythm of: Tachycardia but normal rhythm.   Medicines ordered and prescription drug management:  I ordered medication including acetaminophen for fever.  Zofran for nausea.  Liter of fluid for fluid resuscitation Reevaluation of the patient after these medicines showed that the patient improved I have reviewed the patients home medicines and have made adjustments as needed   Critical Interventions:  Bolus of fluid   Problem List / ED Course:  Abdominal pain, nausea, vomiting.  This is likely a viral gastroenteritis.  This is been going on for 5 days.  I will write him a short course of doxycycline in the event  that he does not get better in the next 3 to 5 days.  Patient amenable to this plan.  On reevaluation after fluid his vital signs have improved.  He is feeling immensely better and now more awake.  Patient had no vomiting since been in the department.  Patient has a primary care doctor appointment on 14 March.  I told to keep that appointment.  Strict turn precautions were given.  He is safe for discharge.  Patient has not of Zofran at home.  I encouraged him to drink plenty of fluids and stick to a brat diet.   Reevaluation:  After the interventions noted above, I reevaluated the patient and found that they have :improved   Dispostion:  After consideration of the diagnostic results and the patients response to treatment, I feel that the patent would benefit from outpatient follow-up with his primary care doctor.  He does not meet inpatient criteria at this time.  Final Clinical Impression(s) / ED Diagnoses Final diagnoses:  Nausea and vomiting, unspecified vomiting type  Generalized abdominal pain    Rx / DC Orders ED Discharge Orders          Ordered     doxycycline (VIBRAMYCIN) 100 MG capsule  2 times daily        02/17/22 2207              Honor Loh Hudson, Cordelia Poche 02/17/22 2208    Gloris Manchester, MD 02/18/22 [phone]

## 2022-02-17 NOTE — ED Triage Notes (Addendum)
PT arrives POV for eval of fever, emesis, sob, headache, malaise. Pt was seen at [facility] on Sunday w/ neg covid/flu testing. Mother reports weakness as well.

## 2022-02-17 NOTE — Discharge Instructions (Addendum)
Please drink plenty of fluids and get plenty of rest.  Stick to a bland diet for the next week.  Return to the emergency department for worsening symptoms.  Please follow-up with your primary care provider.

## 2022-02-20 ENCOUNTER — Telehealth: Payer: Self-pay

## 2022-02-20 ENCOUNTER — Ambulatory Visit: Payer: Self-pay

## 2022-02-20 NOTE — Telephone Encounter (Signed)
Pt caregiver called asking for an antibiotic that will not cause diarrhea. States was given an abx from ED a few days ago but the ED instructed her not to start tx b/c it will cause the pt diarrhea. After reviewing the chart it appears the caregiver and the patient's PCP office communicated today and the resulting recommendation was pt to be seen by ED. On phone with me caregiver stated pt too weak to get up. I explained this is a safety concern and the best action is to get patient to ED for further evaluation.

## 2022-02-20 NOTE — Telephone Encounter (Signed)
°  Chief Complaint: fever Symptoms: fever, vomiting, diarrhea, not eating food and unable to keep most liquids down Frequency: going on since last Thursday per mother Pertinent Negatives: NA Disposition: [x] ED /[] Urgent Care (no appt availability in office) / [] Appointment(In office/virtual)/ []  [facility]/ [] Home Care/ [] Refused Recommended Disposition /[] [facility]/ []  Follow-up with PCP Additional Notes: Pt's mother calling and states that pt's fever keeps going up and down even with taking ibuprofen. Pt has NPA on 03/11/22 with Dr. Joya Gaskins and mother was wanting pt to see him today but advised her Dr. Joya Gaskins is fully booked and unable to see pt until appt. Advised her to take pt back to UC/ED for re-eval.    Reason for Disposition  Patient sounds very sick or weak to the triager  (Exception: mild weakness and hasn't taken fever medicine)  Answer Assessment - Initial Assessment Questions 1. TEMPERATURE: "What is the most recent temperature?"  "How was it measured?"      103 2. ONSET: "When did the fever start?"      Since last Thursday 3. CHILLS: "Do you have chills?" If yes: "How bad are they?"  (e.g., none, mild, moderate, severe)   - NONE: no chills   - MILD: feeling cold   - MODERATE: feeling very cold, some shivering (feels better under a thick blanket)   - SEVERE: feeling extremely cold with shaking chills (general body shaking, rigors; even under a thick blanket)      no 4. OTHER SYMPTOMS: "Do you have any other symptoms besides the fever?"  (e.g., abdomen pain, cough, diarrhea, earache, headache, sore throat, urination pain)     Vomiting, diarrhea, decreased appetite  Protocols used: Fever-A-AH

## 2022-02-22 ENCOUNTER — Observation Stay (HOSPITAL_COMMUNITY)
Admission: EM | Admit: 2022-02-22 | Discharge: 2022-02-23 | Disposition: A | Discharge status: Home / Self Care | Payer: Medicaid Other | Attending: Internal Medicine | Admitting: Internal Medicine

## 2022-02-22 ENCOUNTER — Emergency Department (HOSPITAL_COMMUNITY): Payer: Medicaid Other

## 2022-02-22 ENCOUNTER — Observation Stay (HOSPITAL_COMMUNITY): Payer: Medicaid Other

## 2022-02-22 ENCOUNTER — Other Ambulatory Visit: Payer: Self-pay

## 2022-02-22 ENCOUNTER — Encounter (HOSPITAL_COMMUNITY): Payer: Self-pay | Admitting: Emergency Medicine

## 2022-02-22 DIAGNOSIS — R0602 Shortness of breath: Secondary | ICD-10-CM | POA: Insufficient documentation

## 2022-02-22 DIAGNOSIS — R7401 Elevation of levels of liver transaminase levels: Secondary | ICD-10-CM

## 2022-02-22 DIAGNOSIS — R509 Fever, unspecified: Secondary | ICD-10-CM

## 2022-02-22 DIAGNOSIS — B349 Viral infection, unspecified: Secondary | ICD-10-CM | POA: Diagnosis not present

## 2022-02-22 DIAGNOSIS — Z20822 Contact with and (suspected) exposure to covid-19: Secondary | ICD-10-CM | POA: Insufficient documentation

## 2022-02-22 LAB — CBC
HCT: 45.3 % (ref 39.0–52.0)
Hemoglobin: 15.2 g/dL (ref 13.0–17.0)
MCH: 28.4 pg (ref 26.0–34.0)
MCHC: 33.6 g/dL (ref 30.0–36.0)
MCV: 84.7 fL (ref 80.0–100.0)
Platelets: 274 K/uL (ref 150–400)
RBC: 5.35 MIL/uL (ref 4.22–5.81)
RDW: 13.2 % (ref 11.5–15.5)
WBC: 9.3 K/uL (ref 4.0–10.5)
nRBC: 0 % (ref 0.0–0.2)

## 2022-02-22 LAB — URINALYSIS, ROUTINE W REFLEX MICROSCOPIC
Bilirubin Urine: NEGATIVE
Glucose, UA: NEGATIVE mg/dL
Hgb urine dipstick: NEGATIVE
Ketones, ur: NEGATIVE mg/dL
Leukocytes,Ua: NEGATIVE
Nitrite: NEGATIVE
Protein, ur: NEGATIVE mg/dL
Specific Gravity, Urine: 1.01 (ref 1.005–1.030)
pH: 6 (ref 5.0–8.0)

## 2022-02-22 LAB — HEPATITIS PANEL, ACUTE
HCV Ab: NONREACTIVE
Hep A IgM: NONREACTIVE
Hep B C IgM: NONREACTIVE
Hepatitis B Surface Ag: NONREACTIVE

## 2022-02-22 LAB — C-REACTIVE PROTEIN: CRP: 4.3 mg/dL — ABNORMAL HIGH (ref <1.0)

## 2022-02-22 LAB — COMPREHENSIVE METABOLIC PANEL WITH GFR
ALT: 105 U/L — ABNORMAL HIGH (ref 0–44)
AST: 77 U/L — ABNORMAL HIGH (ref 15–41)
Albumin: 3 g/dL — ABNORMAL LOW (ref 3.5–5.0)
Alkaline Phosphatase: 76 U/L (ref 38–126)
Anion gap: 7 (ref 5–15)
BUN: 5 mg/dL — ABNORMAL LOW (ref 6–20)
CO2: 23 mmol/L (ref 22–32)
Calcium: 8.4 mg/dL — ABNORMAL LOW (ref 8.9–10.3)
Chloride: 103 mmol/L (ref 98–111)
Creatinine, Ser: 0.84 mg/dL (ref 0.61–1.24)
GFR, Estimated: >60 mL/min
Glucose, Bld: 100 mg/dL — ABNORMAL HIGH (ref 70–99)
Potassium: 3.8 mmol/L (ref 3.5–5.1)
Sodium: 133 mmol/L — ABNORMAL LOW (ref 135–145)
Total Bilirubin: 0.3 mg/dL (ref 0.3–1.2)
Total Protein: 6.5 g/dL (ref 6.5–8.1)

## 2022-02-22 LAB — RESP PANEL BY RT-PCR (FLU A&B, COVID) ARPGX2
Influenza A by PCR: NEGATIVE
Influenza B by PCR: NEGATIVE
SARS Coronavirus 2 by RT PCR: NEGATIVE

## 2022-02-22 LAB — LACTIC ACID, PLASMA
Lactic Acid, Venous: 1.3 mmol/L (ref 0.5–1.9)
Lactic Acid, Venous: 1.6 mmol/L (ref 0.5–1.9)

## 2022-02-22 LAB — MAGNESIUM: Magnesium: 2 mg/dL (ref 1.7–2.4)

## 2022-02-22 LAB — TSH: TSH: 2.539 u[IU]/mL (ref 0.350–4.500)

## 2022-02-22 LAB — TROPONIN I (HIGH SENSITIVITY)
Troponin I (High Sensitivity): 11 ng/L (ref <18)
Troponin I (High Sensitivity): 10 ng/L (ref <18)

## 2022-02-22 LAB — HEMOGLOBIN A1C
Hgb A1c MFr Bld: 5.4 % (ref 4.8–5.6)
Mean Plasma Glucose: 108.28 mg/dL

## 2022-02-22 LAB — SEDIMENTATION RATE: Sed Rate: 10 mm/hr (ref 0–16)

## 2022-02-22 LAB — D-DIMER, QUANTITATIVE: D-Dimer, Quant: 2.72 ug/mL-FEU — ABNORMAL HIGH (ref 0.00–0.50)

## 2022-02-22 LAB — PROCALCITONIN: Procalcitonin: 0.16 ng/mL

## 2022-02-22 LAB — CK: Total CK: 79 U/L (ref 49–397)

## 2022-02-22 LAB — HIV ANTIBODY (ROUTINE TESTING W REFLEX): HIV Screen 4th Generation wRfx: NONREACTIVE

## 2022-02-22 LAB — LIPASE, BLOOD: Lipase: 37 U/L (ref 11–51)

## 2022-02-22 MED ORDER — LACTATED RINGERS IV SOLN: INTRAVENOUS | Status: DC

## 2022-02-22 MED ORDER — LACTATED RINGERS IV BOLUS
1000.0000 mL | Freq: Once | INTRAVENOUS | Status: AC
  Administered 2022-02-22: 1000 mL via INTRAVENOUS

## 2022-02-22 MED ORDER — ACETAMINOPHEN 325 MG PO TABS
650.0000 mg | ORAL_TABLET | Freq: Once | ORAL | Status: AC
  Administered 2022-02-22: 650 mg via ORAL
  Filled 2022-02-22: qty 2

## 2022-02-22 MED ORDER — SODIUM CHLORIDE 0.9 % IV BOLUS
1000.0000 mL | Freq: Once | INTRAVENOUS | Status: AC
  Administered 2022-02-22: 1000 mL via INTRAVENOUS

## 2022-02-22 MED ORDER — IOHEXOL 350 MG/ML SOLN
75.0000 mL | Freq: Once | INTRAVENOUS | Status: AC | PRN
  Administered 2022-02-22: 75 mL via INTRAVENOUS

## 2022-02-22 MED ORDER — ACETAMINOPHEN 650 MG RE SUPP: 650.0000 mg | Freq: Four times a day (QID) | RECTAL | Status: DC | PRN

## 2022-02-22 MED ORDER — HYOSCYAMINE SULFATE 0.125 MG SL SUBL
0.2500 mg | SUBLINGUAL_TABLET | SUBLINGUAL | Status: DC | PRN
  Administered 2022-02-23: 0.25 mg via SUBLINGUAL
  Filled 2022-02-22 (×3): qty 2

## 2022-02-22 MED ORDER — PANTOPRAZOLE SODIUM 40 MG IV SOLR
40.0000 mg | Freq: Two times a day (BID) | INTRAVENOUS | Status: DC
  Administered 2022-02-22 – 2022-02-23 (×2): 40 mg via INTRAVENOUS
  Filled 2022-02-22 (×2): qty 10

## 2022-02-22 MED ORDER — HYOSCYAMINE SULFATE 0.125 MG/ML PO SOLN
0.2500 mg | ORAL | Status: DC | PRN
  Filled 2022-02-22: qty 2

## 2022-02-22 MED ORDER — ONDANSETRON 4 MG PO TBDP
4.0000 mg | ORAL_TABLET | Freq: Once | ORAL | Status: AC
  Administered 2022-02-22: 4 mg via ORAL
  Filled 2022-02-22: qty 1

## 2022-02-22 MED ORDER — ACETAMINOPHEN 325 MG PO TABS: 650.0000 mg | ORAL_TABLET | Freq: Four times a day (QID) | ORAL | Status: DC | PRN

## 2022-02-22 NOTE — Progress Notes (Signed)
Jack Juarez received to room 6N24C/6N24C-01 from ED with Febrile illness and Elevated transaminase level.  Pt and mother report 2 weeks of nausea, vomiting and diarrhea with fevers.  After arrival to the unit, he had a [place] sandwich and fries.  He then reported having stomach cramping.  No nausea, just the "poop feeling".  He had a very small BM that did not appear to be diarrhea.  He does appear to be uncomfortable at this time.  IM TS paged to discuss.    02/22/22 2048  Vitals  Temp 98.1 F (36.7 C)  Temp Source Oral  BP (!) 147/81  MAP (mmHg) 95  BP Location Left Arm  BP Method Automatic  Patient Position (if appropriate) Lying  Pulse Rate (!) 107  Pulse Rate Source Dinamap  Resp 20  MEWS COLOR  MEWS Score Color Green  Oxygen Therapy  SpO2 96 %  O2 Device Room Air  Pain Assessment  Pain Scale 0-10  Pain Score 7  Pain Type Acute pain  Pain Location Abdomen  Pain Orientation Lower;Upper  POSS Scale ([place] Opioid Sedation Scale)  POSS *See Group Information* 1-Acceptable,Awake and alert  Height and Weight  Height 5\' 9"  (1.753 m)  Weight 123.9 kg  Type of Scale Used Bed  Type of Weight Actual  BSA (Calculated - sq m) 2.46 sq meters  BMI (Calculated) 40.31  Weight in (lb) to have BMI = 25 168.9  MEWS Score  MEWS Temp 0  MEWS Systolic 0  MEWS Pulse 1  MEWS RR 0  MEWS LOC 0  MEWS Score 1     Patient oriented to room and unit.  Call bell and personal items in reach.  Admission history completed.  Plan of care initiated. BSN RN CMSRN 02/22/2022, 10:47 PM

## 2022-02-22 NOTE — ED Provider Notes (Signed)
[facility] EMERGENCY DEPARTMENT Provider Note   CSN: [id number] Arrival date & time: 02/22/22  1104     History  Chief Complaint  Patient presents with   Shortness of Breath   Vomiting   Fever    Jack Juarez is a 26 y.o. male.  Patient presents to ER chief complaint of fever vomiting shortness of breath and chest pain.  Symptoms ongoing for about 11 days without improvement.  He was seen in urgent care and then seen in the emergency department and started on doxycycline.  Family states he still vomiting and not having any improvement.  He appears be breathing "hard," and was sent to the ER today for evaluation again.  Patient otherwise denies any headache or abdominal pain.  Denies any diarrhea.  Patient does admit to a cough for several days.      Home Medications Prior to Admission medications   Medication Sig Start Date End Date Taking? Authorizing Provider  doxycycline (VIBRAMYCIN) 100 MG capsule Take 1 capsule (100 mg total) by mouth 2 (two) times daily. 02/17/22   Hendricks Limes, PA-C  ibuprofen (ADVIL) 200 MG tablet Take 600 mg by mouth every 6 (six) hours as needed for headache, fever or mild pain.    [provider]  ondansetron (ZOFRAN-ODT) 4 MG disintegrating tablet Take 1 tablet (4 mg total) by mouth every 8 (eight) hours as needed. Patient taking differently: Take 4 mg by mouth every 8 (eight) hours as needed for nausea or vomiting. 02/15/22   Francene Finders, PA-C      Allergies    Bee venom    Review of Systems   Review of Systems  Constitutional:  Negative for fever.  HENT:  Negative for ear pain and sore throat.   Eyes:  Negative for pain.  Respiratory:  Positive for cough and shortness of breath.   Cardiovascular:  Positive for chest pain.  Gastrointestinal:  Negative for abdominal pain.  Genitourinary:  Negative for flank pain.  Musculoskeletal:  Negative for back pain.  Skin:  Negative for color change and rash.   Neurological:  Negative for syncope.  All other systems reviewed and are negative.  Physical Exam Updated Vital Signs BP 121/62    Pulse 89    Temp 99.3 F (37.4 C) (Oral)    Resp 18    SpO2 92%  Physical Exam Constitutional:      Appearance: He is well-developed.  HENT:     Head: Normocephalic.     Nose: Nose normal.  Eyes:     Extraocular Movements: Extraocular movements intact.  Cardiovascular:     Rate and Rhythm: Tachycardia present.  Pulmonary:     Effort: Tachypnea present.     Breath sounds: No decreased breath sounds or wheezing.  Abdominal:     Tenderness: There is no abdominal tenderness. There is no guarding or rebound.  Skin:    Coloration: Skin is not jaundiced.     Findings: No rash.  Neurological:     Mental Status: He is alert. Mental status is at baseline.    ED Results / Procedures / Treatments   Labs (all labs ordered are listed, but only abnormal results are displayed) Labs Reviewed  COMPREHENSIVE METABOLIC PANEL - Abnormal; Notable for the following components:      Result Value   Sodium 133 (*)    Glucose, Bld 100 (*)    BUN 5 (*)    Calcium 8.4 (*)  Albumin 3.0 (*)    AST 77 (*)    ALT 105 (*)    All other components within normal limits  C-REACTIVE PROTEIN - Abnormal; Notable for the following components:   CRP 4.3 (*)    All other components within normal limits  D-DIMER, QUANTITATIVE - Abnormal; Notable for the following components:   D-Dimer, Quant 2.72 (*)    All other components within normal limits  RESP PANEL BY RT-PCR (FLU A&B, COVID) ARPGX2  CULTURE, BLOOD (ROUTINE X 2)  CULTURE, BLOOD (ROUTINE X 2)  LIPASE, BLOOD  CBC  URINALYSIS, ROUTINE W REFLEX MICROSCOPIC  LACTIC ACID, PLASMA  LACTIC ACID, PLASMA  SEDIMENTATION RATE  PROCALCITONIN  CK  MAGNESIUM  TROPONIN I (HIGH SENSITIVITY)  TROPONIN I (HIGH SENSITIVITY)    EKG EKG Interpretation  Date/Time:  Sunday February 22 2022 12:29:43 EST Ventricular Rate:   110 PR Interval:  148 QRS Duration: 144 QT Interval:  342 QTC Calculation: 463 R Axis:   143 Text Interpretation: Sinus tachycardia RBBB and LPFB Inferior infarct, age indeterminate Confirmed by Thamas Jaegers ([id number]) on 02/22/2022 12:31:47 PM  Radiology CT Angio Chest PE W and/or Wo Contrast  Result Date: 02/22/2022 CLINICAL DATA:  Shortness of breath for 11 days. Elevated D-dimer. Clinical suspicion for pulmonary embolism. EXAM: CT ANGIOGRAPHY CHEST WITH CONTRAST TECHNIQUE: Multidetector CT imaging of the chest was performed using the standard protocol during bolus administration of intravenous contrast. Multiplanar CT image reconstructions and MIPs were obtained to evaluate the vascular anatomy. RADIATION DOSE REDUCTION: This exam was performed according to the departmental dose-optimization program which includes automated exposure control, adjustment of the mA and/or kV according to patient size and/or use of iterative reconstruction technique. CONTRAST:  58m OMNIPAQUE IOHEXOL 350 MG/ML SOLN COMPARISON:  None. FINDINGS: Cardiovascular: Satisfactory opacification of pulmonary arteries noted, and no pulmonary emboli identified. No evidence of thoracic aortic dissection or aneurysm. Mediastinum/Nodes: No masses or pathologically enlarged lymph nodes identified. Lungs/Pleura: No pulmonary mass, infiltrate, or effusion. Upper abdomen: No acute findings. Diverticulosis seen involving the visualized portion of the colon. Musculoskeletal: No suspicious bone lesions identified. Review of the MIP images confirms the above findings. IMPRESSION: No evidence of pulmonary embolism or other active disease within the thorax. Colonic diverticulosis incidentally noted. Electronically Signed   By: JMarlaine HindM.D.   On: 02/22/2022 16:17   DG Chest Port 1 View  Result Date: 02/22/2022 CLINICAL DATA:  Cough EXAM: PORTABLE CHEST 1 VIEW COMPARISON:  None. FINDINGS: Normal mediastinum and cardiac silhouette. Low lung  volumes. Normal pulmonary vasculature. No evidence of effusion, infiltrate, or pneumothorax. No acute bony abnormality. IMPRESSION: No acute cardiopulmonary process. Electronically Signed   By: SSuzy BouchardM.D.   On: 02/22/2022 12:33    Procedures Procedures    Medications Ordered in ED Medications  sodium chloride 0.9 % bolus 1,000 mL (0 mLs Intravenous Stopped 02/22/22 1400)  ondansetron (ZOFRAN-ODT) disintegrating tablet 4 mg (4 mg Oral Given 02/22/22 1400)  acetaminophen (TYLENOL) tablet 650 mg (650 mg Oral Given 02/22/22 1424)  iohexol (OMNIPAQUE) 350 MG/ML injection 75 mL (75 mLs Intravenous Contrast Given 02/22/22 1601)    ED Course/ Medical Decision Making/ A&P                           Medical Decision Making Amount and/or Complexity of Data Reviewed Labs: ordered. Radiology: ordered.  Risk OTC drugs. Prescription drug management.   Patient placed on telemetry with mild tachycardia present appears  to be sinus rhythm.  Chart review shows prior urgent care visit approximately a week ago.  Initially has low-grade fevers with tachycardia.  Sepsis protocol initiated.  Her white count is normal lactic acid is normal procalcitonin is normal.  ESR is normal as well as CRP mildly elevated.  Chest x-ray unremarkable urinalysis unremarkable.  D-dimer and then sent given negative work-up thus far.  D-dimer was elevated and CT angio pursued with no evidence of pulmonary embolism.  Etiology of symptoms unclear, patient remains tachypneic with oxygenation-dropped about 92% on room air.  I feel the patient's vital signs are borderline at best, we considered discharge at home, but given persistent fevers and borderline vital signs, hospitalist consulted for further evaluation.        Final Clinical Impression(s) / ED Diagnoses Final diagnoses:  Febrile illness  Shortness of breath    Rx / DC Orders ED Discharge Orders     None         Luna Fuse, MD 02/22/22  [phone]

## 2022-02-22 NOTE — Hospital Course (Addendum)
Recently discharged from ED 2/21 after presenting with NVD and being discharged with doxy for presumed tick borne illness.  Vomiting despite zofran given at [facility] 2/19.  Symptoms persisting despite doxy 2/21.  Presents again to [facility] ED with fever, vomiting, and SOB 11 days. Not improving with abx. Zofran not helping either.  No with generalized weakness.   Hx autism Ten day s fever, cough, chest pain. Low grade temp,  ED did sepsis workup.  D dimer elevated 2.7.  CT angio no PE or PNA.  Tachycardia and tachypnic, o2 sat 92 which is not norm.  Admit for obs.    Thursday weak. Friday fever and throwing up. Went to [facility] Sunday gave antinasuea medicine.   Fever off and on. Given advil and fluids. Friday started pills. Continued to have fevers.   Weak and legs shaky. Throw up. Yellow mucus. Throw up yellow. If he eats throws up.   No sick contacts, no recent travel,   No recent muscle aches or rashes.   Feels dizzy. Can't sleepy. Feels hot and cold.  100.6 max day before admission. For the past two days.    prior 103   Usually never sick.   Diarrhea yesterday after starting doxycyline. 3 to 4 times. This AM havijng diarrhea again. More liquid. No blood.   Some times get SOB, since all of this started. Dry cough since that time.   PMH: None  Meds: No medications  PSH:  None   FH:  Dad  Mom T2DM, blood clots, HTN  SH:  Patient lives with family.  No recent insect bites known.  Non smoker or alchol drinker.    Full code.   LUQ abdominal pain. TTP. Pain ijn epigastrium after vomiting.   Feeling like food sometimes get stuck in chest.

## 2022-02-22 NOTE — H&P (Addendum)
Date: 02/22/2022               Patient Name:  Jack Juarez MRN: [id number]  DOB: 07/02/1996 Age / Sex: 26 y.o., male   PCP: Camillia Herter, NP         Medical Service: Internal Medicine Teaching Service         Attending Physician: Dr. Rayne Du att. providers found    First Contact: Delene Ruffini, MD Pager: [phone]  Second Contact: Rick Duff, MD Pager: [phone]       After Hours (After 5p/  First Contact Pager: [phone]  weekends / holidays): Second Contact Pager: [phone]   SUBJECTIVE   Chief Complaint: abd pain, vomiting, weakness  History of Present Illness:  Patient is a 26 year old male with a history of autism who presents to the ED with 10 days of fever, abdominal pain, vomiting, progressive diffuse weakness, fatigue, and new onset diarrhea, admitted for further management.   Patient is accompanied by mother who provides most of the history. Starting 10 days ago, patient began experiencing left upper quadrant pain nausea and vomiting (non-bilious/non-bloody) and was evaluated in UC on 2/19 and given Zofran for symptoms. He has had worsened PO intake secondary to nausea and vomiting. No improvement with Zofran. Patient subsequently  developed fevers up to 103 which his mother has been controlling with tylenol, a dry non-productive cough, progressive weakness and fatigue. Endorses diaphoresis, no night sweats.  For these reasons, family contacted UC and was instructed to go to ED on 2/21 where they were given prescription for Doxycycline for suspected tick-borne illness. Patient has been taking the medication as prescribed, however, he has not noticed any improvement since then and has now developed diarrhea, typically 3-4 watery, non-bloody bowel movements per day.  Patient denies any recent travel, no insect bites, no new medications, new foods, or exposure to sick contacts.   Previous evaluation in ED with CT abd was negative. He did have mild elevation in AST and ALT at  that time, which seems to have increased slightly since then.   ED Course: In the ED, patient received sepsis workup.  UA negative, Lipase negative, lactic acid negative. Troponin negative. Ck negative.  CRP and D dimer elevated. CT angio no PE. Blood cultures collected. [place] abd obtained for elevated liver enzymes and N/V findings consistent with steatosis.   Meds:  No outpatient medications have been marked as taking for the 02/22/22 encounter [facility] Encounter).    Past Medical History:  Diagnosis Date   Autism     History reviewed. No pertinent surgical history.  Social:  Lives With: mother Occupation: none Support: family Level of Function: able to perform ADLs PCP: Durene Fruits Substances: none  Family History: family history of heart problems, cancer, blood clots, diabetes, HTN  Allergies: Allergies as of 02/22/2022 - Review Complete 02/17/2022  Allergen Reaction Noted   Bee venom Anaphylaxis 10/12/2020    Review of Systems: A complete ROS was negative except as per HPI.   OBJECTIVE:   Physical Exam: Blood pressure 121/74, pulse 98, temperature 99.3 F (37.4 C), temperature source Oral, resp. rate (!) 31, SpO2 96 %.  Constitutional: well-appearing male sitting in bed, in no acute distress HENT: normocephalic atraumatic, mucous membranes moist Eyes: conjunctiva non-erythematous Neck: supple Cardiovascular: regular rate and rhythm, no m/r/g Pulmonary/Chest: normal work of breathing on room air, lungs clear to auscultation bilaterally Abdominal: soft, tender left upper quadrant, non-distended, positive bowel sounds MSK: normal bulk and tone  Neurological: alert & oriented x 3, 5/5 strength in bilateral upper and lower extremities, normal gait Skin: warm and dry Psych: mood and affect appropriate  Labs: CBC    Component Value Date/Time   WBC 9.3 02/22/2022 1120   RBC 5.35 02/22/2022 1120   HGB 15.2 02/22/2022 1120   HGB 16.1 12/10/2021 1453   HCT 45.3  02/22/2022 1120   HCT 47.8 12/10/2021 1453   PLT 274 02/22/2022 1120   PLT 217 12/10/2021 1453   MCV 84.7 02/22/2022 1120   MCV 86 12/10/2021 1453   MCH 28.4 02/22/2022 1120   MCHC 33.6 02/22/2022 1120   RDW 13.2 02/22/2022 1120   RDW 12.5 12/10/2021 1453   LYMPHSABS 3.8 02/17/2022 1742   MONOABS 0.4 02/17/2022 1742   EOSABS 0.2 02/17/2022 1742   BASOSABS 0.1 02/17/2022 1742     CMP     Component Value Date/Time   NA 133 (L) 02/22/2022 1120   NA 140 12/10/2021 1453   K 3.8 02/22/2022 1120   CL 103 02/22/2022 1120   CO2 23 02/22/2022 1120   GLUCOSE 100 (H) 02/22/2022 1120   BUN 5 (L) 02/22/2022 1120   BUN 11 12/10/2021 1453   CREATININE 0.84 02/22/2022 1120   CALCIUM 8.4 (L) 02/22/2022 1120   PROT 6.5 02/22/2022 1120   PROT 7.3 12/10/2021 1453   ALBUMIN 3.0 (L) 02/22/2022 1120   ALBUMIN 4.6 12/10/2021 1453   AST 77 (H) 02/22/2022 1120   ALT 105 (H) 02/22/2022 1120   ALKPHOS 76 02/22/2022 1120   BILITOT 0.3 02/22/2022 1120   BILITOT 0.4 12/10/2021 1453   GFRNONAA >60 02/22/2022 1120    Imaging: CT Angio Chest PE W and/or Wo Contrast  Result Date: 02/22/2022 CLINICAL DATA:  Shortness of breath for 11 days. Elevated D-dimer. Clinical suspicion for pulmonary embolism. EXAM: CT ANGIOGRAPHY CHEST WITH CONTRAST TECHNIQUE: Multidetector CT imaging of the chest was performed using the standard protocol during bolus administration of intravenous contrast. Multiplanar CT image reconstructions and MIPs were obtained to evaluate the vascular anatomy. RADIATION DOSE REDUCTION: This exam was performed according to the departmental dose-optimization program which includes automated exposure control, adjustment of the mA and/or kV according to patient size and/or use of iterative reconstruction technique. CONTRAST:  28mL OMNIPAQUE IOHEXOL 350 MG/ML SOLN COMPARISON:  None. FINDINGS: Cardiovascular: Satisfactory opacification of pulmonary arteries noted, and no pulmonary emboli identified. No  evidence of thoracic aortic dissection or aneurysm. Mediastinum/Nodes: No masses or pathologically enlarged lymph nodes identified. Lungs/Pleura: No pulmonary mass, infiltrate, or effusion. Upper abdomen: No acute findings. Diverticulosis seen involving the visualized portion of the colon. Musculoskeletal: No suspicious bone lesions identified. Review of the MIP images confirms the above findings. IMPRESSION: No evidence of pulmonary embolism or other active disease within the thorax. Colonic diverticulosis incidentally noted. Electronically Signed   By: Marlaine Hind M.D.   On: 02/22/2022 16:17   DG Chest Port 1 View  Result Date: 02/22/2022 CLINICAL DATA:  Cough EXAM: PORTABLE CHEST 1 VIEW COMPARISON:  None. FINDINGS: Normal mediastinum and cardiac silhouette. Low lung volumes. Normal pulmonary vasculature. No evidence of effusion, infiltrate, or pneumothorax. No acute bony abnormality. IMPRESSION: No acute cardiopulmonary process. Electronically Signed   By: Suzy Bouchard M.D.   On: 02/22/2022 12:33   US Abdomen Limited RUQ (LIVER/GB)  Result Date: 02/22/2022 CLINICAL DATA:  Elevated transaminase EXAM: ULTRASOUND ABDOMEN LIMITED RIGHT UPPER QUADRANT COMPARISON:  None. FINDINGS: Gallbladder: Contracted gallbladder with no gallstones or wall thickening visualized. No sonographic Percell Miller  sign noted by sonographer. Common bile duct: Diameter: 2 mm Liver: Hyperechoic liver parenchyma without focal lesion. Portal vein is patent on color Doppler imaging with normal direction of blood flow towards the liver. Other: None. IMPRESSION: Hyperechoic liver parenchyma, most commonly seen with steatosis. No cholelithiasis or other evidence of acute cholecystitis. Electronically Signed   By: Ulyses Jarred M.D.   On: 02/22/2022 19:20    EKG: personally reviewed my interpretation is n/a   ASSESSMENT & PLAN:    Assessment & Plan by Problem: Principal Problem:   Viral infection, unspecified  Suspected viral  illness - differentials include viral illness, gastroenteritis, gastritis - Symptoms have not resolved with doxycyline. Progressing symptoms over past 10 days. He remains tachycardic with stable blood pressure around [id number] systolic. Tachypneic with O2 sats around 95%. Patient is awake, alert, oriented, and appears comfortable at this time.  - N/V/D - Patient does have elevated in liver enzymes. [place] negative for cholelithiasis or cholecystitis. Lipase negative for pancreatitis. S/P LR bolus and now on IVF 100cc/hr for fluid losses. Protonix for symptoms. Can give Zofran for nausea.  - elevated D-dimer and CRP - negative CT chest for PE. Did reveal diverticulosis.  - non-productive cough - CXR and CT now acute cardiopulmonary process. Procal negative as well.  - Fever - Afebrile since presentation to ED. No leukocytosis. UA negative. COVID panel negative. Bcx collected.  Tylenol for fever - fu CMV, HIV, and EBV labs - f/u AM CBC and CMP  Autism  Dispo: Admit patient to Observation with expected length of stay less than 2 midnights.  Signed: Delene Ruffini, MD Internal Medicine Resident PGY-1 Pager: [phone]  02/22/2022, 8:01 PM

## 2022-02-22 NOTE — ED Triage Notes (Signed)
Mom reports fever, vomiting, and SOB x 11 days.  States pt seen in ED on Tuesday and started antibiotics.  States he isn't getting any better and also having generalized weakness.  Nausea medication isn't helping.

## 2022-02-22 NOTE — ED Notes (Signed)
Patient transported to Ultrasound 

## 2022-02-23 LAB — COMPREHENSIVE METABOLIC PANEL
ALT: 89 U/L — ABNORMAL HIGH (ref 0–44)
AST: 64 U/L — ABNORMAL HIGH (ref 15–41)
Albumin: 2.6 g/dL — ABNORMAL LOW (ref 3.5–5.0)
Alkaline Phosphatase: 71 U/L (ref 38–126)
Anion gap: 5 (ref 5–15)
BUN: 6 mg/dL (ref 6–20)
CO2: 26 mmol/L (ref 22–32)
Calcium: 8.3 mg/dL — ABNORMAL LOW (ref 8.9–10.3)
Chloride: 105 mmol/L (ref 98–111)
Creatinine, Ser: 0.9 mg/dL (ref 0.61–1.24)
GFR, Estimated: >60 mL/min (ref >60)
Glucose, Bld: 105 mg/dL — ABNORMAL HIGH (ref 70–99)
Potassium: 3.8 mmol/L (ref 3.5–5.1)
Sodium: 136 mmol/L (ref 135–145)
Total Bilirubin: 0.5 mg/dL (ref 0.3–1.2)
Total Protein: 5.5 g/dL — ABNORMAL LOW (ref 6.5–8.1)

## 2022-02-23 LAB — CBC
HCT: 40.7 % (ref 39.0–52.0)
Hemoglobin: 13.4 g/dL (ref 13.0–17.0)
MCH: 28.2 pg (ref 26.0–34.0)
MCHC: 32.9 g/dL (ref 30.0–36.0)
MCV: 85.7 fL (ref 80.0–100.0)
Platelets: 253 10*3/uL (ref 150–400)
RBC: 4.75 MIL/uL (ref 4.22–5.81)
RDW: 13.3 % (ref 11.5–15.5)
WBC: 9 10*3/uL (ref 4.0–10.5)
nRBC: 0 % (ref 0.0–0.2)

## 2022-02-23 NOTE — Discharge Instructions (Signed)
Dear Mr. Cadavid,  Thank you for trusting [place] with your care.   We treated you in the hospital for a suspected viral illness. I am happy to see that you have made improvements today.  We did notice that your liver had some very mild damage. This will likely get better over the next couple of days. We would like for you to follow up with your primary care doctor in 2 weeks to check your liver and make sure your are feeling good.

## 2022-02-23 NOTE — Discharge Summary (Addendum)
Name: Jack Juarez MRN: [id number] DOB: 28-Apr-1996 26 y.o. PCP: Camillia Herter, NP  Date of Admission: 02/22/2022 11:05 AM Date of Discharge: 02/23/22 Attending Physician: Dr. Philipp Ovens  Discharge Diagnosis: Principal Problem:   Viral infection, unspecified    Discharge Medications: Allergies as of 02/23/2022       Reactions   Bee Venom Anaphylaxis        Medication List     STOP taking these medications    doxycycline 100 MG capsule Commonly known as: VIBRAMYCIN       TAKE these medications    ibuprofen 200 MG tablet Commonly known as: ADVIL Take 600 mg by mouth every 6 (six) hours as needed for headache, fever or mild pain.   ondansetron 4 MG disintegrating tablet Commonly known as: ZOFRAN-ODT Take 1 tablet (4 mg total) by mouth every 8 (eight) hours as needed. What changed: reasons to take this        Disposition and follow-up:   Mr.Jack Juarez was discharged from [facility] in Stable condition.  At the hospital follow up visit please address:  1.  Follow-up:  a. Suspected viral illness - possible EBV and CMV, labs pending      2.  Labs / imaging needed at time of follow-up: liver panel  3.  Pending labs/ test needing follow-up: EBV and CMV  4.  Medication Changes   Follow-up Appointments:  Follow-up Information     Camillia Herter, NP. Schedule an appointment as soon as possible for a visit in 2 week(s).   Specialty: Nurse Practitioner Contact information: [place] [address] [place] Hospital Course by problem list:   Suspected viral illness - patient presented with complaints of fever, nausea, vomiting, diarrhea and cough. Noted to have mildly elevated liver enzymes initially. Evaluated with [place] which was negative for cholelithiasis or cholecystitis. Lipase was negative for pancreatitis. He was started on protonix for his symptoms. He was also noted to have  elevated D dimer in ED and was evaluated with CTA which was negative for PE. CXR was obtained due to complaints of cough but also negative. UA and covid panel were negative. BCX were collected and showed no growth 24 hrs. CMV, HIV, and EBV labs were collected. HIV returned negative, CMV and EBV pending.  He was treated symptomatically with IVFs and tylenol. He showed improvement on evaluation the following day and was felt stable for discharge. Given elevation in LFTs, he was instructed to follow up with PCP in 2 weeks.     Discharge Subjective: Feels better. Difficulty sleeping. Tolerating PO intake. Had one loose stool overnight.  No complaints at this time.   Discharge Exam:   BP 120/73 (BP Location: Left Arm)    Pulse 92    Temp 98.5 F (36.9 C)    Resp 17    Ht 5\' 9"  (1.753 m)    Wt 123.9 kg    SpO2 96%    BMI 40.33 kg/m  Constitutional: well-appearing male sitting in bed, in no acute distress HENT: normocephalic atraumatic, mucous membranes moist Eyes: conjunctiva non-erythematous Neck: supple Cardiovascular: regular rate and rhythm, no m/r/g Pulmonary/Chest: normal work of breathing on room air, lungs clear to auscultation bilaterally Abdominal: soft, non-tender, non-distended MSK: normal bulk and tone Neurological: alert & oriented x 3, 5/5 strength in bilateral upper and lower extremities, normal gait  Skin: warm and dry Psych: mood and affect appropriate   Pertinent Labs, Studies, and Procedures:  CBC Latest Ref Rng & Units 02/23/2022 02/22/2022 02/17/2022  WBC 4.0 - 10.5 K/uL 9.0 9.3 7.4  Hemoglobin 13.0 - 17.0 g/dL 13.4 15.2 15.5  Hematocrit 39.0 - 52.0 % 40.7 45.3 45.3  Platelets 150 - 400 K/uL 253 274 222    CMP Latest Ref Rng & Units 02/23/2022 02/22/2022 02/17/2022  Glucose 70 - 99 mg/dL 105(H) 100(H) 111(H)  BUN 6 - 20 mg/dL 6 5(L) 11  Creatinine 0.61 - 1.24 mg/dL 0.90 0.84 0.93  Sodium 135 - 145 mmol/L 136 133(L) 139  Potassium 3.5 - 5.1 mmol/L 3.8 3.8 3.9  Chloride 98  - 111 mmol/L 105 103 103  CO2 22 - 32 mmol/L 26 23 26   Calcium 8.9 - 10.3 mg/dL 8.3(L) 8.4(L) 8.6(L)  Total Protein 6.5 - 8.1 g/dL 5.5(L) 6.5 6.8  Total Bilirubin 0.3 - 1.2 mg/dL 0.5 0.3 0.5  Alkaline Phos 38 - 126 U/L 71 76 77  AST 15 - 41 U/L 64(H) 77(H) 57(H)  ALT 0 - 44 U/L 89(H) 105(H) 84(H)    CT Angio Chest PE W and/or Wo Contrast  Result Date: 02/22/2022 CLINICAL DATA:  Shortness of breath for 11 days. Elevated D-dimer. Clinical suspicion for pulmonary embolism. EXAM: CT ANGIOGRAPHY CHEST WITH CONTRAST TECHNIQUE: Multidetector CT imaging of the chest was performed using the standard protocol during bolus administration of intravenous contrast. Multiplanar CT image reconstructions and MIPs were obtained to evaluate the vascular anatomy. RADIATION DOSE REDUCTION: This exam was performed according to the departmental dose-optimization program which includes automated exposure control, adjustment of the mA and/or kV according to patient size and/or use of iterative reconstruction technique. CONTRAST:  68mL OMNIPAQUE IOHEXOL 350 MG/ML SOLN COMPARISON:  None. FINDINGS: Cardiovascular: Satisfactory opacification of pulmonary arteries noted, and no pulmonary emboli identified. No evidence of thoracic aortic dissection or aneurysm. Mediastinum/Nodes: No masses or pathologically enlarged lymph nodes identified. Lungs/Pleura: No pulmonary mass, infiltrate, or effusion. Upper abdomen: No acute findings. Diverticulosis seen involving the visualized portion of the colon. Musculoskeletal: No suspicious bone lesions identified. Review of the MIP images confirms the above findings. IMPRESSION: No evidence of pulmonary embolism or other active disease within the thorax. Colonic diverticulosis incidentally noted. Electronically Signed   By: Marlaine Hind M.D.   On: 02/22/2022 16:17   DG Chest Port 1 View  Result Date: 02/22/2022 CLINICAL DATA:  Cough EXAM: PORTABLE CHEST 1 VIEW COMPARISON:  None. FINDINGS:  Normal mediastinum and cardiac silhouette. Low lung volumes. Normal pulmonary vasculature. No evidence of effusion, infiltrate, or pneumothorax. No acute bony abnormality. IMPRESSION: No acute cardiopulmonary process. Electronically Signed   By: Suzy Bouchard M.D.   On: 02/22/2022 12:33   US Abdomen Limited RUQ (LIVER/GB)  Result Date: 02/22/2022 CLINICAL DATA:  Elevated transaminase EXAM: ULTRASOUND ABDOMEN LIMITED RIGHT UPPER QUADRANT COMPARISON:  None. FINDINGS: Gallbladder: Contracted gallbladder with no gallstones or wall thickening visualized. No sonographic Murphy sign noted by sonographer. Common bile duct: Diameter: 2 mm Liver: Hyperechoic liver parenchyma without focal lesion. Portal vein is patent on color Doppler imaging with normal direction of blood flow towards the liver. Other: None. IMPRESSION: Hyperechoic liver parenchyma, most commonly seen with steatosis. No cholelithiasis or other evidence of acute cholecystitis. Electronically Signed   By: Ulyses Jarred M.D.   On: 02/22/2022 19:20     Discharge Instructions: Discharge Instructions     Call MD for:  difficulty breathing, headache or visual  disturbances   Complete by: As directed    Call MD for:  extreme fatigue   Complete by: As directed    Call MD for:  hives   Complete by: As directed    Call MD for:  persistant dizziness or light-headedness   Complete by: As directed    Call MD for:  persistant nausea and vomiting   Complete by: As directed    Call MD for:  redness, tenderness, or signs of infection (pain, swelling, redness, odor or green/yellow discharge around incision site)   Complete by: As directed    Call MD for:  severe uncontrolled pain   Complete by: As directed    Call MD for:  temperature >100.4   Complete by: As directed    Diet - low sodium heart healthy   Complete by: As directed    Increase activity slowly   Complete by: As directed        Signed: Delene Ruffini, MD 02/23/2022, 7:17 PM    Pager: [phone]

## 2022-02-23 NOTE — Discharge Planning (Signed)
Patient discharged home in stable condition. Verbalizes understanding of all discharge instructions, including home medications and follow up appointments. 

## 2022-02-24 ENCOUNTER — Telehealth: Payer: Self-pay

## 2022-02-24 LAB — CMV ANTIBODY, IGG (EIA): CMV Ab - IgG: 1.9 U/mL — ABNORMAL HIGH (ref 0.00–0.59)

## 2022-02-24 LAB — CMV IGM: CMV IgM: >240 AU/mL — ABNORMAL HIGH (ref 0.0–29.9)

## 2022-02-24 NOTE — Telephone Encounter (Signed)
Transition Care Management Follow-up Telephone Call  Call completed with patient's mother, [name]. DPR on file to speak with her  Dr Joya Gaskins is her PCP and she requested Dr Joya Gaskins be her son's PCP.  Date of discharge and from where: 02/23/2022, [facility]  How have you been since you were released from the hospital? She said he is doing much better, but still weak, with some dizziness and nausea. She has been giving him the ondansetron and ibuprofen when needed. She checks his temperature and said he has not had a fever.  Any questions or concerns? No  Items Reviewed: Did the pt receive and understand the discharge instructions provided? Yes  - his mother has to review then.  She said that the patient does not read very well.  Medications obtained and verified? Yes  - he has both medications per his mother and she makes sure to give them to him when needed.  Other? No  Any new allergies since your discharge? No  Dietary orders reviewed? No Do you have support at home? Yes - his mother  [place] and Equipment/Supplies: Were home health services ordered? no If so, what is the name of the agency? N/a  Has the agency set up a time to come to the patient's home? not applicable Were any new equipment or medical supplies ordered?  No What is the name of the medical supply agency? N/a Were you able to get the supplies/equipment? not applicable Do you have any questions related to the use of the equipment or supplies? No  Functional Questionnaire: (I = Independent and D = Dependent) ADLs: independent   Follow up appointments reviewed:  PCP Hospital f/u appt confirmed? Yes  Scheduled to see Dr Joya Gaskins - 03/11/2022.    [facility] f/u appt confirmed?  None scheduled at this time   Are transportation arrangements needed? Yes  - his mother drives If their condition worsens, is the pt aware to call PCP or go to the Emergency Dept.? Yes Was the patient provided with contact  information for the PCP's office or ED? Yes - provided his mother with the phone number Was to pt encouraged to call back with questions or concerns? Yes

## 2022-02-26 LAB — EPSTEIN-BARR VIRUS VCA, IGM: EBV VCA IgM: 37.3 U/mL — ABNORMAL HIGH (ref 0.0–35.9)

## 2022-02-27 LAB — CULTURE, BLOOD (ROUTINE X 2)
Culture: NO GROWTH
Culture: NO GROWTH
Special Requests: ADEQUATE
Special Requests: ADEQUATE

## 2022-03-11 ENCOUNTER — Other Ambulatory Visit: Payer: Self-pay

## 2022-03-11 ENCOUNTER — Encounter: Payer: Self-pay | Admitting: Critical Care Medicine

## 2022-03-11 ENCOUNTER — Ambulatory Visit: Payer: Medicaid Other | Attending: Critical Care Medicine | Admitting: Critical Care Medicine

## 2022-03-11 VITALS — BP 133/84 | HR 73 | Ht 69.0 in | Wt 267.4 lb

## 2022-03-11 DIAGNOSIS — E669 Obesity, unspecified: Secondary | ICD-10-CM | POA: Diagnosis not present

## 2022-03-11 DIAGNOSIS — E785 Hyperlipidemia, unspecified: Secondary | ICD-10-CM | POA: Insufficient documentation

## 2022-03-11 DIAGNOSIS — B251 Cytomegaloviral hepatitis: Secondary | ICD-10-CM | POA: Diagnosis not present

## 2022-03-11 DIAGNOSIS — K760 Fatty (change of) liver, not elsewhere classified: Secondary | ICD-10-CM | POA: Diagnosis not present

## 2022-03-11 DIAGNOSIS — E782 Mixed hyperlipidemia: Secondary | ICD-10-CM

## 2022-03-11 HISTORY — DX: Cytomegaloviral hepatitis: B25.1

## 2022-03-11 NOTE — Assessment & Plan Note (Signed)
Patient has fatty liver I plan to give the patient a Mediterranean plant-based diet to follow educational materials were given at this visit patient instructed to walk 20 to 30 minutes 3-4 times a week as well ?

## 2022-03-11 NOTE — Assessment & Plan Note (Signed)
Seen on imaging studies patient advised to follow a healthy diet increase exercise ?

## 2022-03-11 NOTE — Assessment & Plan Note (Signed)
CMV antibodies were positive and he did have hepatitis with regards to elevated liver functions and symptom complex ? ?Liver functions were improving these will need to be followed up ? ?Patient also had fatty liver playing a role in elevated liver functions ? ?I plan to refer the patient to infectious disease to see if anything else needs to be determined ? ?Patient seems to be improving on his own ?

## 2022-03-11 NOTE — Patient Instructions (Signed)
Referral to infectious disease was made ? ?Labs today include liver function test ? ?You declined the HPV vaccine ? ?Follow the healthy diet that we went over with you ? ?Return to see Dr. Delford Field 5 months ? ? ?

## 2022-03-11 NOTE — Progress Notes (Signed)
? ?Transition of care  Office Visit ? ?Subjective:  ?Patient ID: Jack Juarez, male    DOB: Aug 07, 1996  Age: 26 y.o. MRN: [id number] ? ?CC:  ?Chief Complaint  ?Patient presents with  ? New Patient (Initial Visit)  ? ?TOC visit  ?HPI ?Jack Juarez presents for new patient to establish.  Note patient previously with a another provider but wishes to transfer care.  Patient was hospitalized in February with a viral illness.  He had significant nausea fever myalgias muscle weakness sore throat.  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  He did have elevated liver functions. ? ?Patient comes to the office today in improved status.  He is having no real symptoms at this time.  He does have increased weight BMI of 39-1/2.  He does not follow a healthy diet.  He does have autism and is not able to be employed because of this.  He has mental limitations.  He does live with his mother.  Below is a copy of the discharge summary from February. ? ?DOB: [birth year] 26 y.o. ?PCP: Camillia Herter, NP ?  ?Date of Admission: 02/22/2022 11:05 AM ?Date of Discharge: 02/23/22 ?Attending Physician: Dr. Philipp Ovens ?  ?Discharge Diagnosis: ?Principal Problem: ?  Viral infection, unspecified ?   ?Disposition and follow-up:   ?Mr.Jack Juarez was discharged from [facility] in Stable condition.  At the hospital follow up visit please address: ?  ?1.  Follow-up: ?            a. Suspected viral illness - possible EBV and CMV, labs pending  ?             ?  ?  ?2.  Labs / imaging needed at time of follow-up: liver panel ?  ?3.  Pending labs/ test needing follow-up: EBV and CMV ?  ?4.  Medication Changes ?   ?Hospital Course by problem list:  ?  ?Suspected viral illness ?- patient presented with complaints of fever, nausea, vomiting, diarrhea and cough. Noted to have mildly elevated liver enzymes initially. Evaluated with [place] which was negative for cholelithiasis or cholecystitis. Lipase was negative for  pancreatitis. He was started on protonix for his symptoms. He was also noted to have elevated D dimer in ED and was evaluated with CTA which was negative for PE. CXR was obtained due to complaints of cough but also negative. UA and covid panel were negative. BCX were collected and showed no growth 24 hrs. CMV, HIV, and EBV labs were collected. HIV returned negative, CMV and EBV pending.  He was treated symptomatically with IVFs and tylenol. He showed improvement on evaluation the following day and was felt stable for discharge. Given elevation in LFTs, he was instructed to follow up with PCP in 2 weeks.  ?  ?  ?Discharge Subjective: ?Feels better. Difficulty sleeping. Tolerating PO intake. Had one loose stool overnight.  No complaints at this time.  ? ?This patient did have a transition of care nurse visit as noted below. ?RN Toc ?Transition Care Management Follow-up Telephone Call ?  ?Call completed with patient's mother, Jack Juarez. DPR on file to speak with her  ?Dr Joya Gaskins is her PCP and she requested Dr Joya Gaskins be her son's PCP.  ?Date of discharge and from where: 02/23/2022, [facility]  ?How have you been since you were released from the hospital? She said he is doing much better, but still weak, with some dizziness and nausea. She has  been giving him the ondansetron and ibuprofen when needed. She checks his temperature and said he has not had a fever.  ?Any questions or concerns? No ?  ?Items Reviewed: ?Did the pt receive and understand the discharge instructions provided? Yes  - his mother has to review then.  She said that the patient does not read very well.  ?Medications obtained and verified? Yes  - he has both medications per his mother and she makes sure to give them to him when needed.  ?Other? No  ?Any new allergies since your discharge? No  ?Dietary orders reviewed? No ?Do you have support at home? Yes - his mother ?  ?Home Care and Equipment/Supplies: ?Were home health services ordered? no ?If  so, what is the name of the agency? N/a  ?Has the agency set up a time to come to the patient's home? not applicable ?Were any new equipment or medical supplies ordered?  No ?What is the name of the medical supply agency? N/a ?Were you able to get the supplies/equipment? not applicable ?Do you have any questions related to the use of the equipment or supplies? No ?  ?Functional Questionnaire: (I = Independent and D = Dependent) ?ADLs: independent ?  ?No evidence of any immune condition.  He did have fatty liver seen on his abdominal CT.  He has an S shaped T and lumbar spine noted.  He has been screened for diabetes which is negative.  He does have hyperlipidemia.  No history of hypertension.  He had 1 HPV vaccine in December with the previous provider he does not wish to have further vaccines.  He has had 3 COVID vaccinations.  His COVID test was negative during his previous hospitalization. ? ? ?  ?Past Medical History:  ?Diagnosis Date  ? Autism   ? ? ?History reviewed. No pertinent surgical history. ? ?Family History  ?Problem Relation Age of Onset  ? Diabetes Mother   ? Clotting disorder Mother   ? Hypertension Mother   ? ? ?Social History  ? ?Socioeconomic History  ? Marital status: Single  ?  Spouse name: Not on file  ? Number of children: Not on file  ? Years of education: Not on file  ? Highest education level: Not on file  ?Occupational History  ? Not on file  ?Tobacco Use  ? Smoking status: Never  ? Smokeless tobacco: Never  ?Vaping Use  ? Vaping Use: Never used  ?Substance and Sexual Activity  ? Alcohol use: Never  ? Drug use: Never  ? Sexual activity: Not on file  ?Other Topics Concern  ? Not on file  ?Social History Narrative  ? Not on file  ? ?Social Determinants of Health  ? ?Financial Resource Strain: Not on file  ?Food Insecurity: Not on file  ?Transportation Needs: Not on file  ?Physical Activity: Not on file  ?Stress: Not on file  ?Social Connections: Not on file  ?Intimate Partner Violence: Not  on file  ? ? ?ROS ?Review of Systems  ?Constitutional: Negative.  Negative for chills, diaphoresis, fatigue and fever.  ?HENT:  Positive for sinus pressure. Negative for congestion, dental problem, drooling, ear discharge, ear pain, facial swelling, hearing loss, nosebleeds, postnasal drip, rhinorrhea, sinus pain, sneezing, sore throat, tinnitus, trouble swallowing and voice change.   ?Eyes: Negative.  Negative for photophobia, pain, discharge, redness, itching and visual disturbance.  ?Respiratory:  Positive for cough and shortness of breath. Negative for apnea, choking, chest tightness, wheezing and stridor.   ?  Cardiovascular: Negative.  Negative for chest pain, palpitations and leg swelling.  ?Gastrointestinal: Negative.  Negative for abdominal distention, abdominal pain, blood in stool, constipation, diarrhea, nausea and vomiting.  ?Endocrine: Negative for polydipsia.  ?Genitourinary: Negative.  Negative for difficulty urinating, dysuria, flank pain, frequency, genital sores, hematuria, penile discharge, penile pain and urgency.  ?Musculoskeletal: Negative.  Negative for arthralgias, back pain, myalgias and neck pain.  ?Skin: Negative.  Negative for rash.  ?Allergic/Immunologic: Negative.  Negative for environmental allergies and food allergies.  ?Neurological:  Positive for dizziness and weakness. Negative for tremors, seizures, syncope and headaches.  ?Hematological: Negative.  Negative for adenopathy. Does not bruise/bleed easily.  ?Psychiatric/Behavioral:  Positive for sleep disturbance. Negative for agitation, decreased concentration, dysphoric mood and suicidal ideas. The patient is not nervous/anxious and is not hyperactive.   ?     Awakes mid night.  No snoring  ? ?Objective:  ? ?Today's Vitals: BP 133/84   Pulse 73   Ht 5\' 9"  (1.753 m)   Wt 267 lb 6.4 oz (121.3 kg)   SpO2 95%   BMI 39.49 kg/m?  ? ?Physical Exam ?Vitals reviewed.  ?Constitutional:   ?   Appearance: Normal appearance. He is  well-developed. He is obese. He is not diaphoretic.  ?HENT:  ?   Head: Normocephalic and atraumatic.  ?   Nose: Rhinorrhea present. No nasal deformity, septal deviation, mucosal edema or congestion.  ?   Right Sinus: No maxill

## 2022-03-11 NOTE — Assessment & Plan Note (Signed)
Plan to follow Mediterranean diet and improve exercise no medications indicated ?

## 2022-03-12 LAB — COMPREHENSIVE METABOLIC PANEL
ALT: 124 IU/L — ABNORMAL HIGH (ref 0–44)
AST: 67 IU/L — ABNORMAL HIGH (ref 0–40)
Albumin/Globulin Ratio: 1.5 (ref 1.2–2.2)
Albumin: 4.2 g/dL (ref 4.1–5.2)
Alkaline Phosphatase: 77 IU/L (ref 44–121)
BUN/Creatinine Ratio: 17 (ref 9–20)
BUN: 13 mg/dL (ref 6–20)
Bilirubin Total: 0.4 mg/dL (ref 0.0–1.2)
CO2: 25 mmol/L (ref 20–29)
Calcium: 9.6 mg/dL (ref 8.7–10.2)
Chloride: 103 mmol/L (ref 96–106)
Creatinine, Ser: 0.76 mg/dL (ref 0.76–1.27)
Globulin, Total: 2.8 g/dL (ref 1.5–4.5)
Glucose: 99 mg/dL (ref 70–99)
Potassium: 5.2 mmol/L (ref 3.5–5.2)
Sodium: 141 mmol/L (ref 134–144)
Total Protein: 7 g/dL (ref 6.0–8.5)
eGFR: 127 mL/min/{1.73_m2} (ref >59)

## 2022-03-13 ENCOUNTER — Telehealth: Payer: Self-pay

## 2022-03-13 NOTE — Telephone Encounter (Signed)
Pts mother  was called and is aware of results, DOB was confirmed.  ?

## 2022-03-13 NOTE — Telephone Encounter (Signed)
-----   Message from Storm Frisk, MD sent at 03/12/2022  7:19 AM EDT ----- ?Let pt know Liver fxn still elevated  he needs to keep appt at infectious dis  call his mother with results, he has limited mental function as he is autistic ?

## 2022-03-17 ENCOUNTER — Other Ambulatory Visit: Payer: Self-pay

## 2022-03-17 ENCOUNTER — Encounter: Payer: Self-pay | Admitting: Internal Medicine

## 2022-03-17 ENCOUNTER — Ambulatory Visit (INDEPENDENT_AMBULATORY_CARE_PROVIDER_SITE_OTHER): Payer: Medicaid Other | Admitting: Internal Medicine

## 2022-03-17 VITALS — BP 133/83 | HR 83 | Temp 98.1°F | Wt 268.0 lb

## 2022-03-17 DIAGNOSIS — K759 Inflammatory liver disease, unspecified: Secondary | ICD-10-CM

## 2022-03-17 NOTE — Progress Notes (Signed)
? ?  ?Patient: Jack Juarez  ?DOB: 1996-02-28 ?MRN: [id number] ?PCP: Storm Frisk, MD  ? ?  ? ?Patient Active Problem List  ? Diagnosis Date Noted  ? 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 Obesity with body mass index (BMI) of 35.0 to 39.9 without comorbidity 03/11/2022  ? Fatty liver 03/11/2022  ? Hyperlipidemia 03/11/2022  ?  ? ?Subjective:  ?Jack Juarez is a 26 y.o. M with autism presents for positive CMV/EBV IgM. Pt has been hospitalized at  February 2/26-/27 for suspected viral illness with GI symptoms +cough. He stated N/V/D and fever  started about a month ago, "all of a sudden".  Symptoms have resolved following discharge. Referred to ID due to abnormal CMV/EBV work up(during hospitalization) and elevated liver enzymes(during and following hospitalization). ?-EBV IgM 37.3 on 02/22/22 ?-CMV IgG 1.9, IgM >240 on 02/22/22 ?-AST/ALT on 03/11/22 67/124 ?Demographic: Mother form [place], pt born in [place]. Mother reports pt is fully immunized. Lives at home with mom. No other exposures.  ? ?Review of Systems  ?All other systems reviewed and are negative. ? ?Past Medical History:  ?Diagnosis Date  ? Autism   ? ? ?No outpatient medications prior to visit.  ? ?No facility-administered medications prior to visit.  ?  ? ?Allergies  ?Allergen Reactions  ? Bee Venom Anaphylaxis  ? ? ?Social History  ? ?Tobacco Use  ? Smoking status: Never  ? Smokeless tobacco: Never  ?Vaping Use  ? Vaping Use: Never used  ?Substance Use Topics  ? Alcohol use: Never  ? Drug use: Never  ? ? ?Family History  ?Problem Relation Age of Onset  ? Diabetes Mother   ? Clotting disorder Mother   ? Hypertension Mother   ? ? ?Objective:  ?There were no vitals filed for this visit. ?There is no height or weight on file to calculate BMI. ? ?Physical Exam ?Constitutional:   ?   General: He is not in acute distress. ?   Appearance: He is normal weight. He is not toxic-appearing.  ?HENT:  ?   Head: Normocephalic and atraumatic.  ?    Right Ear: External ear normal.  ?   Left Ear: External ear normal.  ?   Nose: No congestion or rhinorrhea.  ?   Mouth/Throat:  ?   Mouth: Mucous membranes are moist.  ?   Pharynx: Oropharynx is clear.  ?Eyes:  ?   Extraocular Movements: Extraocular movements intact.  ?   Conjunctiva/sclera: Conjunctivae normal.  ?   Pupils: Pupils are equal, round, and reactive to light.  ?Cardiovascular:  ?   Rate and Rhythm: Normal rate and regular rhythm.  ?   Heart sounds: No murmur heard. ?  No friction rub. No gallop.  ?Pulmonary:  ?   Effort: Pulmonary effort is normal.  ?   Breath sounds: Normal breath sounds.  ?Abdominal:  ?   General: Abdomen is flat. Bowel sounds are normal.  ?   Palpations: Abdomen is soft.  ?Musculoskeletal:     ?   General: No swelling. Normal range of motion.  ?   Cervical back: Normal range of motion and neck supple.  ?Skin: ?   General: Skin is warm and dry.  ?Neurological:  ?   General: No focal deficit present.  ?   Mental Status: He is oriented to person, place, and time.  ?Psychiatric:     ?   Mood and Affect: Mood normal.  ? ? ?Lab Results: ?Lab Results  ?  Component Value Date  ? WBC 9.0 02/23/2022  ? HGB 13.4 02/23/2022  ? HCT 40.7 02/23/2022  ? MCV 85.7 02/23/2022  ? PLT 253 02/23/2022  ?  ?Lab Results  ?Component Value Date  ? CREATININE 0.76 03/11/2022  ? BUN 13 03/11/2022  ? NA 141 03/11/2022  ? K 5.2 03/11/2022  ? CL 103 03/11/2022  ? CO2 25 03/11/2022  ?  ?Lab Results  ?Component Value Date  ? ALT 124 (H) 03/11/2022  ? AST 67 (H) 03/11/2022  ? ALKPHOS 77 03/11/2022  ? BILITOT 0.4 03/11/2022  ?  ? ?Assessment & Plan:  ?#Elevated Liver enzymes ?#Likely fatty liver ?-Suspect pt's symptoms leading to hospitalization in February was 2/2 viral etiology(CMC/EBV). Acute CMC/EBV could lead to GI symptoms and elevated liver enzyme. 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 ?-In regards to elevated liver enzymes, [place] on 2/26 showed evidence of  steatosis. Fatty liver ->elevate liver enzymes. I recommend lifestyle modifications and PCP follow-up ?Plan: ?-Labs today: cbc, cmp, hepatitis serology ?-Follow-up in one month ? ?Danelle Earthly, MD ?[facility] Infectious Disease ?[facility] ? ? ?03/17/22  ?9:49 AM  ?

## 2022-03-18 LAB — COMPLETE METABOLIC PANEL WITH GFR
AG Ratio: 1.6 (calc) (ref 1.0–2.5)
ALT: 149 U/L — ABNORMAL HIGH (ref 9–46)
AST: 72 U/L — ABNORMAL HIGH (ref 10–40)
Albumin: 4.4 g/dL (ref 3.6–5.1)
Alkaline phosphatase (APISO): 70 U/L (ref 36–130)
BUN: 12 mg/dL (ref 7–25)
CO2: 23 mmol/L (ref 20–32)
Calcium: 9.6 mg/dL (ref 8.6–10.3)
Chloride: 107 mmol/L (ref 98–110)
Creat: 0.65 mg/dL (ref 0.60–1.24)
Globulin: 2.8 g/dL (calc) (ref 1.9–3.7)
Glucose, Bld: 100 mg/dL — ABNORMAL HIGH (ref 65–99)
Potassium: 4.4 mmol/L (ref 3.5–5.3)
Sodium: 140 mmol/L (ref 135–146)
Total Bilirubin: 0.4 mg/dL (ref 0.2–1.2)
Total Protein: 7.2 g/dL (ref 6.1–8.1)
eGFR: 133 mL/min/{1.73_m2} (ref >=60)

## 2022-03-18 LAB — HEPATITIS A ANTIBODY, TOTAL: Hepatitis A AB,Total: NONREACTIVE

## 2022-03-18 LAB — CBC WITH DIFFERENTIAL/PLATELET
Absolute Monocytes: 656 cells/uL (ref 200–950)
Basophils Absolute: 79 cells/uL (ref 0–200)
Basophils Relative: 1 %
Eosinophils Absolute: 229 cells/uL (ref 15–500)
Eosinophils Relative: 2.9 %
HCT: 47 % (ref 38.5–50.0)
Hemoglobin: 15.7 g/dL (ref 13.2–17.1)
Lymphs Abs: 4487 cells/uL — ABNORMAL HIGH (ref 850–3900)
MCH: 28.7 pg (ref 27.0–33.0)
MCHC: 33.4 g/dL (ref 32.0–36.0)
MCV: 85.9 fL (ref 80.0–100.0)
MPV: 9.8 fL (ref 7.5–12.5)
Monocytes Relative: 8.3 %
Neutro Abs: 2449 cells/uL (ref 1500–7800)
Neutrophils Relative %: 31 %
Platelets: 294 10*3/uL (ref 140–400)
RBC: 5.47 10*6/uL (ref 4.20–5.80)
RDW: 13.6 % (ref 11.0–15.0)
Total Lymphocyte: 56.8 %
WBC: 7.9 10*3/uL (ref 3.8–10.8)

## 2022-03-18 LAB — HEPATITIS C ANTIBODY
Hepatitis C Ab: NONREACTIVE
SIGNAL TO CUT-OFF: 0.06 (ref <1.00)

## 2022-03-18 LAB — HEPATITIS B SURFACE ANTIGEN: Hepatitis B Surface Ag: NONREACTIVE

## 2022-03-18 LAB — HEPATITIS B SURFACE ANTIBODY,QUALITATIVE: Hep B S Ab: NONREACTIVE

## 2022-03-18 LAB — HEPATITIS A ANTIBODY, IGM: Hep A IgM: NONREACTIVE

## 2022-04-01 ENCOUNTER — Telehealth: Payer: Self-pay

## 2022-04-01 NOTE — Telephone Encounter (Signed)
Patient mother aware of results. She stated that she has been trying to get him to eat more vegetables and fruits to help him lose weight. She wanted to ask Dr.Singh was there any foods that could help with his liver? ? ? ?Jack Juarez, CMA ? ?

## 2022-04-01 NOTE — Telephone Encounter (Signed)
-----   Message from Danelle Earthly, MD sent at 04/01/2022  2:52 PM EDT ----- ?Slight increase in AST/ALT but seems to plateauing. Will repeat cmp at next visit. Needs Hep A and B vaccine.  ?

## 2022-04-20 ENCOUNTER — Encounter: Payer: Self-pay | Admitting: Internal Medicine

## 2022-04-20 ENCOUNTER — Other Ambulatory Visit: Payer: Self-pay

## 2022-04-20 ENCOUNTER — Ambulatory Visit (INDEPENDENT_AMBULATORY_CARE_PROVIDER_SITE_OTHER): Payer: Medicaid Other | Admitting: Internal Medicine

## 2022-04-20 VITALS — BP 138/89 | HR 69 | Resp 16 | Ht 69.0 in | Wt 259.0 lb

## 2022-04-20 DIAGNOSIS — Z23 Encounter for immunization: Secondary | ICD-10-CM | POA: Diagnosis not present

## 2022-04-20 DIAGNOSIS — R748 Abnormal levels of other serum enzymes: Secondary | ICD-10-CM | POA: Diagnosis not present

## 2022-04-20 LAB — COMPLETE METABOLIC PANEL WITH GFR
AG Ratio: 1.6 (calc) (ref 1.0–2.5)
ALT: 73 U/L — ABNORMAL HIGH (ref 9–46)
AST: 33 U/L (ref 10–40)
Albumin: 4.5 g/dL (ref 3.6–5.1)
Alkaline phosphatase (APISO): 68 U/L (ref 36–130)
BUN: 12 mg/dL (ref 7–25)
CO2: 27 mmol/L (ref 20–32)
Calcium: 9.8 mg/dL (ref 8.6–10.3)
Chloride: 106 mmol/L (ref 98–110)
Creat: 0.72 mg/dL (ref 0.60–1.24)
Globulin: 2.9 g/dL (calc) (ref 1.9–3.7)
Glucose, Bld: 95 mg/dL (ref 65–99)
Potassium: 4.3 mmol/L (ref 3.5–5.3)
Sodium: 139 mmol/L (ref 135–146)
Total Bilirubin: 0.5 mg/dL (ref 0.2–1.2)
Total Protein: 7.4 g/dL (ref 6.1–8.1)
eGFR: 129 mL/min/{1.73_m2} (ref >=60)

## 2022-04-20 NOTE — Progress Notes (Signed)
? ?   ? ? ? ? ?Patient Active Problem List  ? Diagnosis Date Noted  ? 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 Obesity with body mass index (BMI) of 35.0 to 39.9 without comorbidity 03/11/2022  ? Fatty liver 03/11/2022  ? Hyperlipidemia 03/11/2022  ? ? ?Patient's Medications  ? No medications on file  ? ? ?Subjective: ?Jack Juarez is a 26 y.o. M with autism presents for positive CMV/EBV IgM. Pt has been hospitalized at  February 2/26-/27 for suspected viral illness with GI symptoms +cough. He stated N/V/D and fever  started about a month ago, "all of a sudden".  Symptoms have resolved following discharge. Referred to ID due to abnormal CMV/EBV work up(during hospitalization) and elevated liver enzymes(during and following hospitalization). ?-EBV IgM 37.3 on 02/22/22 ?-CMV IgG 1.9, IgM >240 on 02/22/22 ?-AST/ALT on 03/11/22 67/124 ?-US abdomen with hypercholic liver prosenchyma commonly seen with steatosis ?Demographic: Mother form [place], pt born in [place]. Mother reports pt is fully immunized. Lives at home with mom. No other exposures.  ?Today: Pt presents with mother. He reports he eats more fruits and vegetables. He goes for walks. No GI complaints today.  ?Review of Systems: ?Review of Systems  ?All other systems reviewed and are negative. ? ?Past Medical History:  ?Diagnosis Date  ? Autism   ? ? ?Social History  ? ?Tobacco Use  ? Smoking status: Never  ? Smokeless tobacco: Never  ?Vaping Use  ? Vaping Use: Never used  ?Substance Use Topics  ? Alcohol use: Never  ? Drug use: Never  ? ? ?Family History  ?Problem Relation Age of Onset  ? Diabetes Mother   ? Clotting disorder Mother   ? Hypertension Mother   ? ? ?Allergies  ?Allergen Reactions  ? Bee Venom Anaphylaxis  ? ? ?Health Maintenance  ?Topic Date Due  ? INFLUENZA VACCINE  07/28/2022  ? TETANUS/TDAP  12/11/2031  ? Hepatitis C Screening  Completed  ? HIV Screening  Completed  ? HPV VACCINES  Discontinued  ? COVID-19 Vaccine  Discontinued   ? ? ?Objective: ? ?Vitals:  ? 04/20/22 1003  ?BP: 138/89  ?Pulse: 69  ?Resp: 16  ?Weight: 259 lb (117.5 kg)  ?Height: 5\' 9"  (1.753 m)  ? ?Body mass index is 38.25 kg/m?. ? ?Physical Exam ?Constitutional:   ?   General: He is not in acute distress. ?   Appearance: He is normal weight. He is not toxic-appearing.  ?HENT:  ?   Head: Normocephalic and atraumatic.  ?   Right Ear: External ear normal.  ?   Left Ear: External ear normal.  ?   Nose: No congestion or rhinorrhea.  ?   Mouth/Throat:  ?   Mouth: Mucous membranes are moist.  ?   Pharynx: Oropharynx is clear.  ?Eyes:  ?   Extraocular Movements: Extraocular movements intact.  ?   Conjunctiva/sclera: Conjunctivae normal.  ?   Pupils: Pupils are equal, round, and reactive to light.  ?Cardiovascular:  ?   Rate and Rhythm: Normal rate and regular rhythm.  ?   Heart sounds: No murmur heard. ?  No friction rub. No gallop.  ?Pulmonary:  ?   Effort: Pulmonary effort is normal.  ?   Breath sounds: Normal breath sounds.  ?Abdominal:  ?   General: Abdomen is flat. Bowel sounds are normal.  ?   Palpations: Abdomen is soft.  ?Musculoskeletal:     ?   General: No swelling. Normal range of  motion.  ?   Cervical back: Normal range of motion and neck supple.  ?Skin: ?   General: Skin is warm and dry.  ?Neurological:  ?   General: No focal deficit present.  ?   Mental Status: He is oriented to person, place, and time.  ?Psychiatric:     ?   Mood and Affect: Mood normal.  ? ? ?Lab Results ?Lab Results  ?Component Value Date  ? WBC 7.9 03/17/2022  ? HGB 15.7 03/17/2022  ? HCT 47.0 03/17/2022  ? MCV 85.9 03/17/2022  ? PLT 294 03/17/2022  ?  ?Lab Results  ?Component Value Date  ? CREATININE 0.65 03/17/2022  ? BUN 12 03/17/2022  ? NA 140 03/17/2022  ? K 4.4 03/17/2022  ? CL 107 03/17/2022  ? CO2 23 03/17/2022  ?  ?Lab Results  ?Component Value Date  ? ALT 149 (H) 03/17/2022  ? AST 72 (H) 03/17/2022  ? ALKPHOS 77 03/11/2022  ? BILITOT 0.4 03/17/2022  ?  ?Lab Results  ?Component Value  Date  ? CHOL 192 12/10/2021  ? HDL 37 (L) 12/10/2021  ? LDLCALC 115 (H) 12/10/2021  ? TRIG 228 (H) 12/10/2021  ? CHOLHDL 5.2 (H) 12/10/2021  ? ?No results found for: LABRPR, RPRTITER ?No results found for: HIV1RNAQUANT, HIV1RNAVL, CD4TABS ?  ?#Elevated Liver enzymes ?#Likely fatty liver ?-Suspect pt's symptoms leading to hospitalization in February was 2/2 viral etiology(CMC/EBV). Acute CMC/EBV could lead to GI symptoms and elevated liver enzyme. 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 ?-In regards to elevated liver enzymes, [place] on 2/26 showed evidence of steatosis. Fatty liver ->elevate liver enzymes. Repeat labs on 3/21 showed elevated AST/ALT but plateauing values.  ?-Pt has intentional 9lb weight loss since last visit ?Plan: ?-Cmp today ?-Hep A and B vaccine today ?-Continue lifestyle modifications and PCP follow-up. Discussed increasing fruits/vegetable in diet and decreasing red meat/cheeses.  ?-Follow-up PRN ? ? ?Danelle Earthly, MD ?[facility] Infectious [facility] ?[facility] ?04/20/2022, 10:11 AM  ?

## 2022-04-20 NOTE — Addendum Note (Signed)
Addended by: Clayborne Artist A on: 04/20/2022 10:33 AM ? ? Modules accepted: Orders ? ?

## 2022-04-21 ENCOUNTER — Telehealth: Payer: Self-pay

## 2022-04-21 NOTE — Telephone Encounter (Signed)
-----   Message from Laurice Record, MD sent at 04/21/2022  9:21 AM EDT ----- ?LFTs trending down. ALT 73 form 149. Continue lifestyle modifications(diet and exercise). ?

## 2022-04-21 NOTE — Telephone Encounter (Signed)
Spoke with patient's mother, relayed that liver enzymes are improving and encouraged continued lifestyle medication with diet and exercise. She verbalized understanding and has no further questions.  ? ?Sandie Ano, RN ? ?

## 2022-07-15 ENCOUNTER — Ambulatory Visit: Payer: Medicaid Other | Attending: Critical Care Medicine | Admitting: Critical Care Medicine

## 2022-07-15 ENCOUNTER — Encounter: Payer: Self-pay | Admitting: Critical Care Medicine

## 2022-07-15 VITALS — BP 127/77 | HR 56 | Temp 98.1°F | Resp 16 | Wt 241.0 lb

## 2022-07-15 DIAGNOSIS — Z833 Family history of diabetes mellitus: Secondary | ICD-10-CM | POA: Insufficient documentation

## 2022-07-15 DIAGNOSIS — B251 Cytomegaloviral hepatitis: Secondary | ICD-10-CM | POA: Diagnosis not present

## 2022-07-15 DIAGNOSIS — G479 Sleep disorder, unspecified: Secondary | ICD-10-CM | POA: Diagnosis not present

## 2022-07-15 DIAGNOSIS — B349 Viral infection, unspecified: Secondary | ICD-10-CM | POA: Diagnosis not present

## 2022-07-15 DIAGNOSIS — M549 Dorsalgia, unspecified: Secondary | ICD-10-CM | POA: Insufficient documentation

## 2022-07-15 DIAGNOSIS — E669 Obesity, unspecified: Secondary | ICD-10-CM | POA: Diagnosis not present

## 2022-07-15 DIAGNOSIS — K760 Fatty (change of) liver, not elsewhere classified: Secondary | ICD-10-CM | POA: Insufficient documentation

## 2022-07-15 DIAGNOSIS — Z6835 Body mass index (BMI) 35.0-35.9, adult: Secondary | ICD-10-CM | POA: Insufficient documentation

## 2022-07-15 DIAGNOSIS — E782 Mixed hyperlipidemia: Secondary | ICD-10-CM

## 2022-07-15 DIAGNOSIS — Z23 Encounter for immunization: Secondary | ICD-10-CM | POA: Insufficient documentation

## 2022-07-15 DIAGNOSIS — F840 Autistic disorder: Secondary | ICD-10-CM | POA: Diagnosis not present

## 2022-07-15 DIAGNOSIS — E785 Hyperlipidemia, unspecified: Secondary | ICD-10-CM | POA: Diagnosis not present

## 2022-07-15 DIAGNOSIS — R300 Dysuria: Secondary | ICD-10-CM | POA: Diagnosis not present

## 2022-07-15 HISTORY — DX: Sleep disorder, unspecified: G47.9

## 2022-07-15 LAB — POCT URINALYSIS DIP (CLINITEK)
Bilirubin, UA: NEGATIVE
Blood, UA: NEGATIVE
Glucose, UA: NEGATIVE mg/dL
Ketones, POC UA: NEGATIVE mg/dL
Leukocytes, UA: NEGATIVE
Nitrite, UA: NEGATIVE
POC PROTEIN,UA: 30 — AB
Spec Grav, UA: 1.025 (ref 1.010–1.025)
Urobilinogen, UA: 0.2 E.U./dL
pH, UA: 6 (ref 5.0–8.0)

## 2022-07-15 NOTE — Assessment & Plan Note (Addendum)
Cleared from infectious disease standpoint LFT's trending down Will reassess at follow up  Final round of Hepatitis A and Hepatitis B vaccines given today

## 2022-07-15 NOTE — Assessment & Plan Note (Signed)
Will order lipid panel at future visit Predict improvement with his new lifestyle and dietary changes

## 2022-07-15 NOTE — Progress Notes (Deleted)
Established Office Visit  Subjective:  Patient ID: Jack Juarez, male    DOB: 1996-07-18  Age: 25 y.o. MRN: [id number]  CC:  No chief complaint on file.   HPI 02/2022 Jack Juarez presents for new patient to establish.  Note patient previously with a another provider but wishes to transfer care.  Patient was hospitalized in February with a viral illness.  He had significant nausea fever myalgias muscle weakness sore throat.  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  He did have elevated liver functions.  Patient comes to the office today in improved status.  He is having no real symptoms at this time.  He does have increased weight BMI of 39-1/2.  He does not follow a healthy diet.  He does have autism and is not able to be employed because of this.  He has mental limitations.  He does live with his mother.  Below is a copy of the discharge summary from February.  DOB: November 25, 1996 25 y.o. PCP: Rema Fendt, NP   Date of Admission: 02/22/2022 11:05 AM Date of Discharge: 02/23/22 Attending Physician: Dr. Antony Contras   Discharge Diagnosis: Principal Problem:   Viral infection, unspecified    Disposition and follow-up:   Mr.Jack Juarez was discharged from [facility] in Stable condition.  At the hospital follow up visit please address:   1.  Follow-up:             a. Suspected viral illness - possible EBV and CMV, labs pending                   2.  Labs / imaging needed at time of follow-up: liver panel   3.  Pending labs/ test needing follow-up: EBV and CMV   4.  Medication Changes    Hospital Course by problem list:    Suspected viral illness - patient presented with complaints of fever, nausea, vomiting, diarrhea and cough. Noted to have mildly elevated liver enzymes initially. Evaluated with [place] which was negative for cholelithiasis or cholecystitis. Lipase was negative for pancreatitis. He was started on protonix for his symptoms. He was  also noted to have elevated D dimer in ED and was evaluated with CTA which was negative for PE. CXR was obtained due to complaints of cough but also negative. UA and covid panel were negative. BCX were collected and showed no growth 24 hrs. CMV, HIV, and EBV labs were collected. HIV returned negative, CMV and EBV pending.  He was treated symptomatically with IVFs and tylenol. He showed improvement on evaluation the following day and was felt stable for discharge. Given elevation in LFTs, he was instructed to follow up with PCP in 2 weeks.      Discharge Subjective: Feels better. Difficulty sleeping. Tolerating PO intake. Had one loose stool overnight.  No complaints at this time.   This patient did have a transition of care nurse visit as noted below. RN Toc Transition Care Management Follow-up Telephone Call   Call completed with patient's mother, Jack Juarez. DPR on file to speak with her  Dr Delford Field is her PCP and she requested Dr Delford Field be her son's PCP.  Date of discharge and from where: 02/23/2022, [facility]  How have you been since you were released from the hospital? She said he is doing much better, but still weak, with some dizziness and nausea. She has been giving him the ondansetron and ibuprofen when needed. She checks his  temperature and said he has not had a fever.  Any questions or concerns? No   Items Reviewed: Did the pt receive and understand the discharge instructions provided? Yes  - his mother has to review then.  She said that the patient does not read very well.  Medications obtained and verified? Yes  - he has both medications per his mother and she makes sure to give them to him when needed.  Other? No  Any new allergies since your discharge? No  Dietary orders reviewed? No Do you have support at home? Yes - his mother   [place] and Equipment/Supplies: Were home health services ordered? no If so, what is the name of the agency? N/a  Has the agency set up a  time to come to the patient's home? not applicable Were any new equipment or medical supplies ordered?  No What is the name of the medical supply agency? N/a Were you able to get the supplies/equipment? not applicable Do you have any questions related to the use of the equipment or supplies? No   Functional Questionnaire: (I = Independent and D = Dependent) ADLs: independent   No evidence of any immune condition.  He did have fatty liver seen on his abdominal CT.  He has an S shaped T and lumbar spine noted.  He has been screened for diabetes which is negative.  He does have hyperlipidemia.  No history of hypertension.  He had 1 HPV vaccine in December with the previous provider he does not wish to have further vaccines.  He has had 3 COVID vaccinations.  His COVID test was negative during his previous hospitalization.  [id number]  Cytomegalic inclusion virus hepatitis ([place]) - Primary    CMV antibodies were positive and he did have hepatitis with regards to elevated liver functions and symptom complex  Liver functions were improving these will need to be followed up  Patient also had fatty liver playing a role in elevated liver functions  I plan to refer the patient to infectious disease to see if anything else needs to be determined  Patient seems to be improving on his own      Relevant Orders   Ambulatory referral to Infectious Disease   Comprehensive metabolic panel   Fatty liver    Seen on imaging studies patient advised to follow a healthy diet increase exercise        Other   Obesity with body mass index (BMI) of 35.0 to 39.9 without comorbidity    Patient has fatty liver I plan to give the patient a Mediterranean plant-based diet to follow educational materials were given at this visit patient instructed to walk 20 to 30 minutes 3-4 times a week as well      Hyperlipidemia    Plan to follow Mediterranean diet and improve exercise no medications indicated      03/2022 ID  visit: #Elevated Liver enzymes #Likely fatty liver -Suspect pt's symptoms leading to hospitalization in February was 2/2 viral etiology(CMC/EBV). Acute CMC/EBV could lead to GI symptoms and elevated liver enzyme. 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 -In regards to elevated liver enzymes, [place] on 2/26 showed evidence of steatosis. Fatty liver ->elevate liver enzymes. Repeat labs on 3/21 showed elevated AST/ALT but plateauing values.  -Pt has intentional 9lb weight loss since last visit Plan: -Cmp today -Hep A and B vaccine today -Continue lifestyle modifications and PCP follow-up. Discussed increasing fruits/vegetable in diet and  decreasing red meat/cheeses.  -Follow-up PRN     Danelle Earthly, MD   Past Medical History:  Diagnosis Date   Autism     No past surgical history on file.  Family History  Problem Relation Age of Onset   Diabetes Mother    Clotting disorder Mother    Hypertension Mother     Social History   Socioeconomic History   Marital status: Single    Spouse name: Not on file   Number of children: Not on file   Years of education: Not on file   Highest education level: Not on file  Occupational History   Not on file  Tobacco Use   Smoking status: Never   Smokeless tobacco: Never  Vaping Use   Vaping Use: Never used  Substance and Sexual Activity   Alcohol use: Never   Drug use: Never   Sexual activity: Not on file  Other Topics Concern   Not on file  Social History Narrative   Not on file   Social Determinants of Health   Financial Resource Strain: Not on file  Food Insecurity: Not on file  Transportation Needs: Not on file  Physical Activity: Not on file  Stress: Not on file  Social Connections: Not on file  Intimate Partner Violence: Not on file    ROS Review of Systems  Constitutional: Negative.  Negative for chills, diaphoresis, fatigue and fever.  HENT:  Positive for sinus pressure.  Negative for congestion, dental problem, drooling, ear discharge, ear pain, facial swelling, hearing loss, nosebleeds, postnasal drip, rhinorrhea, sinus pain, sneezing, sore throat, tinnitus, trouble swallowing and voice change.   Eyes: Negative.  Negative for photophobia, pain, discharge, redness, itching and visual disturbance.  Respiratory:  Positive for cough and shortness of breath. Negative for apnea, choking, chest tightness, wheezing and stridor.   Cardiovascular: Negative.  Negative for chest pain, palpitations and leg swelling.  Gastrointestinal: Negative.  Negative for abdominal distention, abdominal pain, blood in stool, constipation, diarrhea, nausea and vomiting.  Endocrine: Negative for polydipsia.  Genitourinary: Negative.  Negative for difficulty urinating, dysuria, flank pain, frequency, genital sores, hematuria, penile discharge, penile pain and urgency.  Musculoskeletal: Negative.  Negative for arthralgias, back pain, myalgias and neck pain.  Skin: Negative.  Negative for rash.  Allergic/Immunologic: Negative.  Negative for environmental allergies and food allergies.  Neurological:  Positive for dizziness and weakness. Negative for tremors, seizures, syncope and headaches.  Hematological: Negative.  Negative for adenopathy. Does not bruise/bleed easily.  Psychiatric/Behavioral:  Positive for sleep disturbance. Negative for agitation, decreased concentration, dysphoric mood and suicidal ideas. The patient is not nervous/anxious and is not hyperactive.        Awakes mid night.  No snoring    Objective:   Today's Vitals: There were no vitals taken for this visit.  Physical Exam Vitals reviewed.  Constitutional:      Appearance: Normal appearance. He is well-developed. He is obese. He is not diaphoretic.  HENT:     Head: Normocephalic and atraumatic.     Nose: Rhinorrhea present. No nasal deformity, septal deviation, mucosal edema or congestion.     Right Sinus: No  maxillary sinus tenderness or frontal sinus tenderness.     Left Sinus: No maxillary sinus tenderness or frontal sinus tenderness.     Mouth/Throat:     Mouth: Mucous membranes are moist.     Pharynx: Oropharynx is clear. No oropharyngeal exudate.  Eyes:     General: No scleral icterus.  Conjunctiva/sclera: Conjunctivae normal.     Pupils: Pupils are equal, round, and reactive to light.  Neck:     Thyroid: No thyromegaly.     Vascular: No carotid bruit or JVD.     Trachea: Trachea normal. No tracheal tenderness or tracheal deviation.  Cardiovascular:     Rate and Rhythm: Normal rate and regular rhythm.     Chest Wall: PMI is not displaced.     Pulses: Normal pulses. No decreased pulses.     Heart sounds: Normal heart sounds, S1 normal and S2 normal. Heart sounds not distant. No murmur heard.    No systolic murmur is present.     No diastolic murmur is present.     No friction rub. No gallop. No S3 or S4 sounds.  Pulmonary:     Effort: Pulmonary effort is normal. No tachypnea, accessory muscle usage or respiratory distress.     Breath sounds: Normal breath sounds. No stridor. No decreased breath sounds, wheezing, rhonchi or rales.  Chest:     Chest wall: No tenderness.  Abdominal:     General: Bowel sounds are normal. There is no distension.     Palpations: Abdomen is soft. Abdomen is not rigid.     Tenderness: There is no abdominal tenderness. There is no guarding or rebound.  Musculoskeletal:        General: Normal range of motion.     Cervical back: Normal range of motion and neck supple. No edema, erythema or rigidity. No muscular tenderness. Normal range of motion.  Lymphadenopathy:     Head:     Right side of head: No submental or submandibular adenopathy.     Left side of head: No submental or submandibular adenopathy.     Cervical: No cervical adenopathy.  Skin:    General: Skin is warm and dry.     Coloration: Skin is not pale.     Findings: No rash.     Nails:  There is no clubbing.  Neurological:     General: No focal deficit present.     Mental Status: He is alert and oriented to person, place, and time.     Sensory: No sensory deficit.  Psychiatric:        Mood and Affect: Mood normal.        Speech: Speech normal.        Behavior: Behavior normal.        Thought Content: Thought content normal.        Judgment: Judgment normal.     Assessment & Plan:   Problem List Items Addressed This Visit   None  No outpatient encounter medications on file as of 07/15/2022.   No facility-administered encounter medications on file as of 07/15/2022.  41 minutes spent reviewing records this is a transition of care visit as well this visit occurred more than 2 weeks after the hospitalization and RN visit was already occurring  Follow-up: No follow-ups on file.   Asencion Noble, MD

## 2022-07-15 NOTE — Assessment & Plan Note (Addendum)
Patient is down 26 lbs after beginning diet and exercise recommendations Recommend he continue Will reassess liver enzymes at follow up

## 2022-07-15 NOTE — Assessment & Plan Note (Signed)
History and Physical exam findings suggestive of a musculoskeletal source. Urine analysis negative today.  Patient given at home exercises/stretches

## 2022-07-15 NOTE — Progress Notes (Signed)
Established Patient Office Visit  Subjective   Patient ID: Jack Juarez, male    DOB: [birth year]  Age: 26 y.o. MRN: [id number]  Chief Complaint  Patient presents with   Follow-up   Dysuria     Jack Juarez is a 26 year old male who presents today to follow up on elevated liver enzymes. 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  He last saw them in 03/2022 where he was instructed to follow up as needed as his LFT's were consistently trending down. He was given the Hepatitis A and Hepatitis B vaccine through infectious disease. His mother who accompanies him, reports that she was told to receive the last of the shots through [place].   He was also found to have fatty liver disease and was recommended a mediterranean diet last time we saw him. He has since lost 26 lbs through diet and exercise. Also, his blood pressure is now within normal range compared to an elevated reading at our previous visit.  He states he feels back to his baseline.  He does state some recent burning with urination. Has not noticed any lesions or rashes of his genital area. His mother states she noticed his urine appeared dark.   He has noted some issues with sleeping in the past that seem to be still be occurring. Mom reports he will often go to bed very early following his walk at the park. She believes this may be contributing to his disturbance as he is also a very light sleeper.  He then wakes up in the early AM and is unable to return to sleep. Mom states patient does not snore.   His prior history from myself, his hospitalization and infectious disease is below:      HPI 02/2022 Jack Juarez presents for new patient to establish.  Note patient previously with a another provider but wishes to transfer care.  Patient was hospitalized in February with a viral illness.  He had significant nausea fever myalgias muscle weakness sore throat.  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  He did have elevated liver functions.  Patient comes to the office today in improved status.  He is having no real symptoms at this time.  He does have increased weight BMI of 39-1/2.  He does not follow a healthy diet.  He does have autism and is not able to be employed because of this.  He has mental limitations.  He does live with his mother.  Below is a copy of the discharge summary from February.   DOB: 17-Aug-1996 25 y.o. PCP: Rema Fendt, NP   Date of Admission: 02/22/2022 11:05 AM Date of Discharge: 02/23/22 Attending Physician: Dr. Antony Contras   Discharge Diagnosis: Principal Problem:   Viral infection, unspecified    Disposition and follow-up:   Jack.Willet E Adorno was discharged from [facility] in Stable condition.  At the hospital follow up visit please address:   1.  Follow-up:             a. Suspected viral illness - possible EBV and CMV, labs pending                   2.  Labs / imaging needed at time of follow-up: liver panel   3.  Pending labs/ test needing follow-up: EBV and CMV   4.  Medication Changes    Hospital Course by problem list:  Suspected viral illness - patient presented with complaints of fever, nausea, vomiting, diarrhea and cough. Noted to have mildly elevated liver enzymes initially. Evaluated with [place] which was negative for cholelithiasis or cholecystitis. Lipase was negative for pancreatitis. He was started on protonix for his symptoms. He was also noted to have elevated D dimer in ED and was evaluated with CTA which was negative for PE. CXR was obtained due to complaints of cough but also negative. UA and covid panel were negative. BCX were collected and showed no growth 24 hrs. CMV, HIV, and EBV labs were collected. HIV returned negative, CMV and EBV pending.  He was treated symptomatically with IVFs and tylenol. He showed improvement on evaluation the following day and was felt stable for discharge.  Given elevation in LFTs, he was instructed to follow up with PCP in 2 weeks.      Discharge Subjective: Feels better. Difficulty sleeping. Tolerating PO intake. Had one loose stool overnight.  No complaints at this time.    This patient did have a transition of care nurse visit as noted below. RN Toc Transition Care Management Follow-up Telephone Call   Call completed with patient's mother, Jack Juarez. DPR on file to speak with her  Dr Delford Field is her PCP and she requested Dr Delford Field be her son's PCP.  Date of discharge and from where: 02/23/2022, [facility]  How have you been since you were released from the hospital? She said he is doing much better, but still weak, with some dizziness and nausea. She has been giving him the ondansetron and ibuprofen when needed. She checks his temperature and said he has not had a fever.  Any questions or concerns? No   Items Reviewed: Did the pt receive and understand the discharge instructions provided? Yes  - his mother has to review then.  She said that the patient does not read very well.  Medications obtained and verified? Yes  - he has both medications per his mother and she makes sure to give them to him when needed.  Other? No  Any new allergies since your discharge? No  Dietary orders reviewed? No Do you have support at home? Yes - his mother   Home Care and Equipment/Supplies: Were home health services ordered? no If so, what is the name of the agency? N/a  Has the agency set up a time to come to the patient's home? not applicable Were any new equipment or medical supplies ordered?  No What is the name of the medical supply agency? N/a Were you able to get the supplies/equipment? not applicable Do you have any questions related to the use of the equipment or supplies? No   Functional Questionnaire: (I = Independent and D = Dependent) ADLs: independent   No evidence of any immune condition.  He did have fatty liver seen on his  abdominal CT.  He has an S shaped T and lumbar spine noted.  He has been screened for diabetes which is negative.  He does have hyperlipidemia.  No history of hypertension.  He had 1 HPV vaccine in December with the previous provider he does not wish to have further vaccines.  He has had 3 COVID vaccinations.  His COVID test was negative during his previous hospitalization.   03/2022 ID visit: #Elevated Liver enzymes #Likely fatty liver -Suspect pt's symptoms leading to hospitalization in February was 2/2 viral etiology(CMC/EBV). Acute CMC/EBV could lead to GI symptoms and elevated liver enzyme. This is a self limited  illness in immunocompetent patents and his symptoms have resolved. -In regards to elevated liver enzymes, [place] on 2/26 showed evidence of steatosis. Fatty liver ->elevate liver enzymes. Repeat labs on 3/21 showed elevated AST/ALT but plateauing values.  -Pt has intentional 9lb weight loss since last visit Plan: -Cmp today -Hep A and B vaccine today -Continue lifestyle modifications and PCP follow-up. Discussed increasing fruits/vegetable in diet and decreasing red meat/cheeses.  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 Social History   Tobacco Use   Smoking status: Never   Smokeless tobacco: Never  Vaping Use   Vaping Use: Never used  Substance Use Topics   Alcohol use: Never   Drug use: Never   Family History  Problem Relation Age of Onset   Diabetes Mother    Clotting disorder Mother    Hypertension Mother    Allergies  Allergen Reactions   Bee Venom  Anaphylaxis       Review of Systems  Genitourinary:  Positive for dysuria.  Musculoskeletal:  Positive for back pain.  Psychiatric/Behavioral:         Sleep disturbance - awakes mid night, no snoring  All other systems reviewed and are negative.     Objective:     BP 127/77 (BP Location: Left Arm, Patient Position: Sitting, Cuff Size: Large)   Pulse (!) 56   Temp 98.1 F (36.7 C) (Oral)   Resp 16   Wt 241 lb (109.3 kg)   SpO2 98%   BMI 35.59 kg/m     Physical Exam Exam conducted with a chaperone present (Chaperone present for GU exam).  Constitutional:      Appearance: He is obese.  HENT:     Head: Normocephalic and atraumatic.     Right Ear: External ear normal.     Left Ear: External ear normal.     Mouth/Throat:     Mouth: Mucous membranes are moist.     Pharynx: Oropharynx is clear.  Cardiovascular:     Rate and Rhythm: Normal rate and regular rhythm.     Pulses: Normal pulses.     Heart sounds: Normal heart sounds.  Pulmonary:     Effort: Pulmonary effort is normal.     Breath sounds: Normal breath sounds.  Abdominal:     General: Bowel sounds are normal.  Genitourinary:    Penis: Normal.      Testes: Normal.  Musculoskeletal:     Comments: Lower back tenderness with palpation of muscle  Negative Costovertebral tenderness   Neurological:     Mental Status: He is alert and oriented to person, place, and time. Mental status is at baseline.  Psychiatric:        Mood and Affect: Mood normal.        Behavior: Behavior normal.      Results for orders placed or performed in visit on 07/15/22  POCT URINALYSIS DIP (CLINITEK)  Result Value Ref Range   Color, UA yellow yellow   Clarity, UA clear clear   Glucose, UA negative negative mg/dL  Bilirubin, UA negative negative   Ketones, POC UA negative negative mg/dL   Spec Grav, UA 9.678 9.381 - 1.025   Blood, UA negative negative   pH, UA 6.0 5.0 - 8.0   POC PROTEIN,UA =30 (A) negative, trace    Urobilinogen, UA 0.2 0.2 or 1.0 E.U./dL   Nitrite, UA Negative Negative   Leukocytes, UA Negative Negative       The ASCVD Risk score (Arnett DK, et al., 2019) failed to calculate for the following reasons:   The 2019 ASCVD risk score is only valid for ages 28 to 57    Assessment & Plan:   Problem List Items Addressed This Visit       Digestive   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    Cleared from infectious disease standpoint LFT's trending down Will reassess at follow up  Final round of Hepatitis A and Hepatitis B vaccines given today       Fatty liver    Patient is down 26 lbs after beginning diet and exercise recommendations Recommend he continue Will reassess liver enzymes at follow up         Other   Obesity with body mass index (BMI) of 35.0 to 39.9 without comorbidity    Patient is down 26 lbs after beginning diet and exercise recommendations Recommend he continue      Hyperlipidemia    Will order lipid panel at future visit Predict improvement with his new lifestyle and dietary changes       Dysuria    Urine analysis, POCT and culture today  Will call patient with culture results       Relevant Orders   POCT URINALYSIS DIP (CLINITEK) (Completed)   Urinalysis   Urine Culture   Back pain    History and Physical exam findings suggestive of a musculoskeletal source. Urine analysis negative today.  Patient given at home exercises/stretches        Sleep disturbance    Scored very low on Epworth sleepiness scale Mom reports patient will sometimes go to bed very early and believes this may be contributing. Sleep hygiene discussed with patient Will hold off on sleep study at this time       38 minutes spent extra time needed for patient education Return in about 5 months (around 12/15/2022).    Shan Levans, MD

## 2022-07-15 NOTE — Patient Instructions (Signed)
A urinalysis will be obtained and sent to the lab along with a culture we will call you results  Congratulations on excellent improvement in your weight loss and exercise program with your diet changes well  Follow the back exercises as below or consider yoga to improve the strength of your lower back and your core muscle strength  Hepatitis a and B vaccine to complete your series was given  Return to see Dr. Delford Field 5 months

## 2022-07-15 NOTE — Progress Notes (Signed)
F/u CMV Has back pain and discomfort with urination x 3 days

## 2022-07-15 NOTE — Assessment & Plan Note (Signed)
Patient is down 26 lbs after beginning diet and exercise recommendations Recommend he continue

## 2022-07-15 NOTE — Assessment & Plan Note (Signed)
Urine analysis, POCT and culture today  Will call patient with culture results

## 2022-07-15 NOTE — Assessment & Plan Note (Signed)
Scored very low on Epworth sleepiness scale Mom reports patient will sometimes go to bed very early and believes this may be contributing. Sleep hygiene discussed with patient Will hold off on sleep study at this time

## 2022-07-16 LAB — URINALYSIS
Bilirubin, UA: NEGATIVE
Glucose, UA: NEGATIVE
Ketones, UA: NEGATIVE
Leukocytes,UA: NEGATIVE
Nitrite, UA: NEGATIVE
RBC, UA: NEGATIVE
Specific Gravity, UA: 1.018 (ref 1.005–1.030)
Urobilinogen, Ur: 0.2 mg/dL (ref 0.2–1.0)
pH, UA: 6 (ref 5.0–7.5)

## 2022-07-17 ENCOUNTER — Telehealth: Payer: Self-pay

## 2022-07-17 LAB — URINE CULTURE: Organism ID, Bacteria: NO GROWTH

## 2022-07-17 NOTE — Telephone Encounter (Signed)
Pt mother was called and is aware of results, DOB was confirmed.  ?

## 2022-07-17 NOTE — Telephone Encounter (Signed)
-----   Message from Storm Frisk, MD sent at 07/17/2022  6:15 AM EDT ----- Let pt know urine shows no infection   continue to drink plenty of water

## 2022-07-17 NOTE — Progress Notes (Signed)
Let pt know urine shows no infection   continue to drink plenty of water

## 2022-11-14 IMAGING — DX DG CHEST 2V
2 series · 2 of 2 positions shown · non-contrast
Comparison: Prior chest radiographs 11/12/2016 and earlier.

CLINICAL DATA: Shortness of breath. Additional history provided:
Fever, emesis, shortness of breath, headache, malaise.

EXAM:
CHEST - 2 VIEW

[w chest pa]
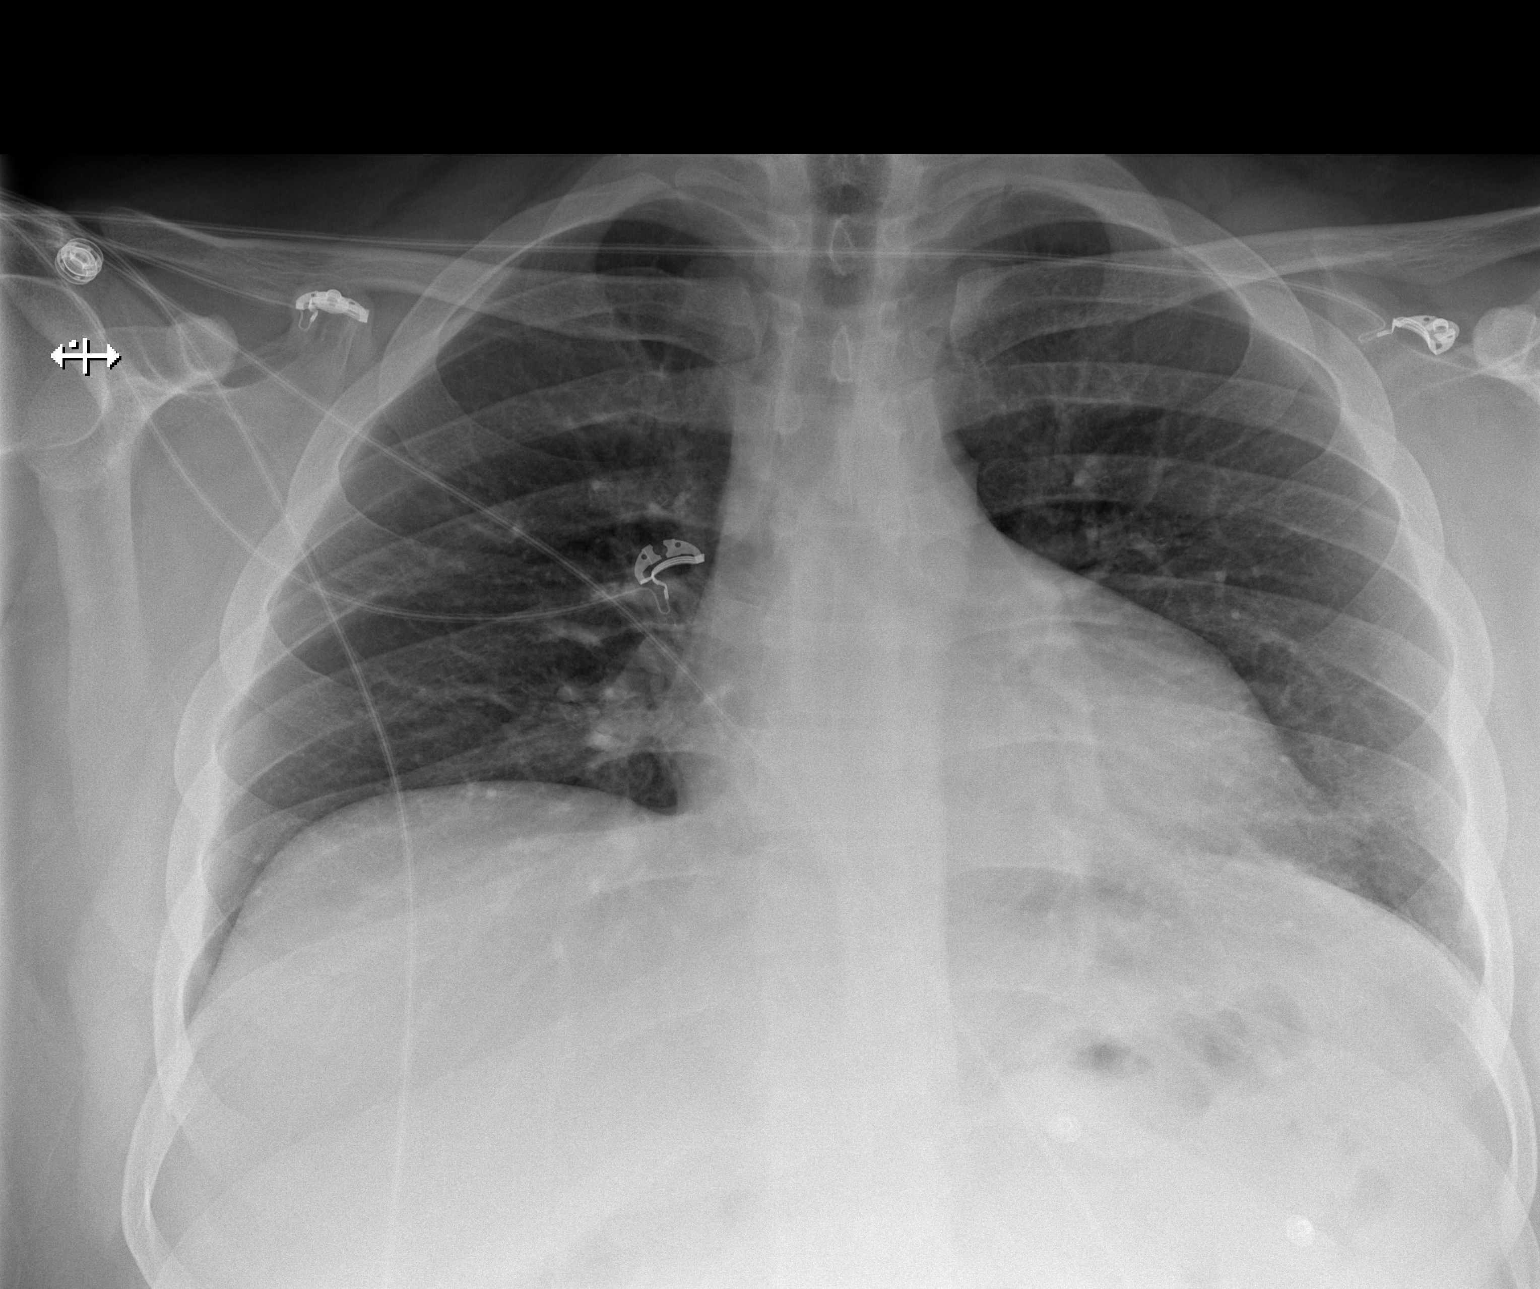

[w chest lat]
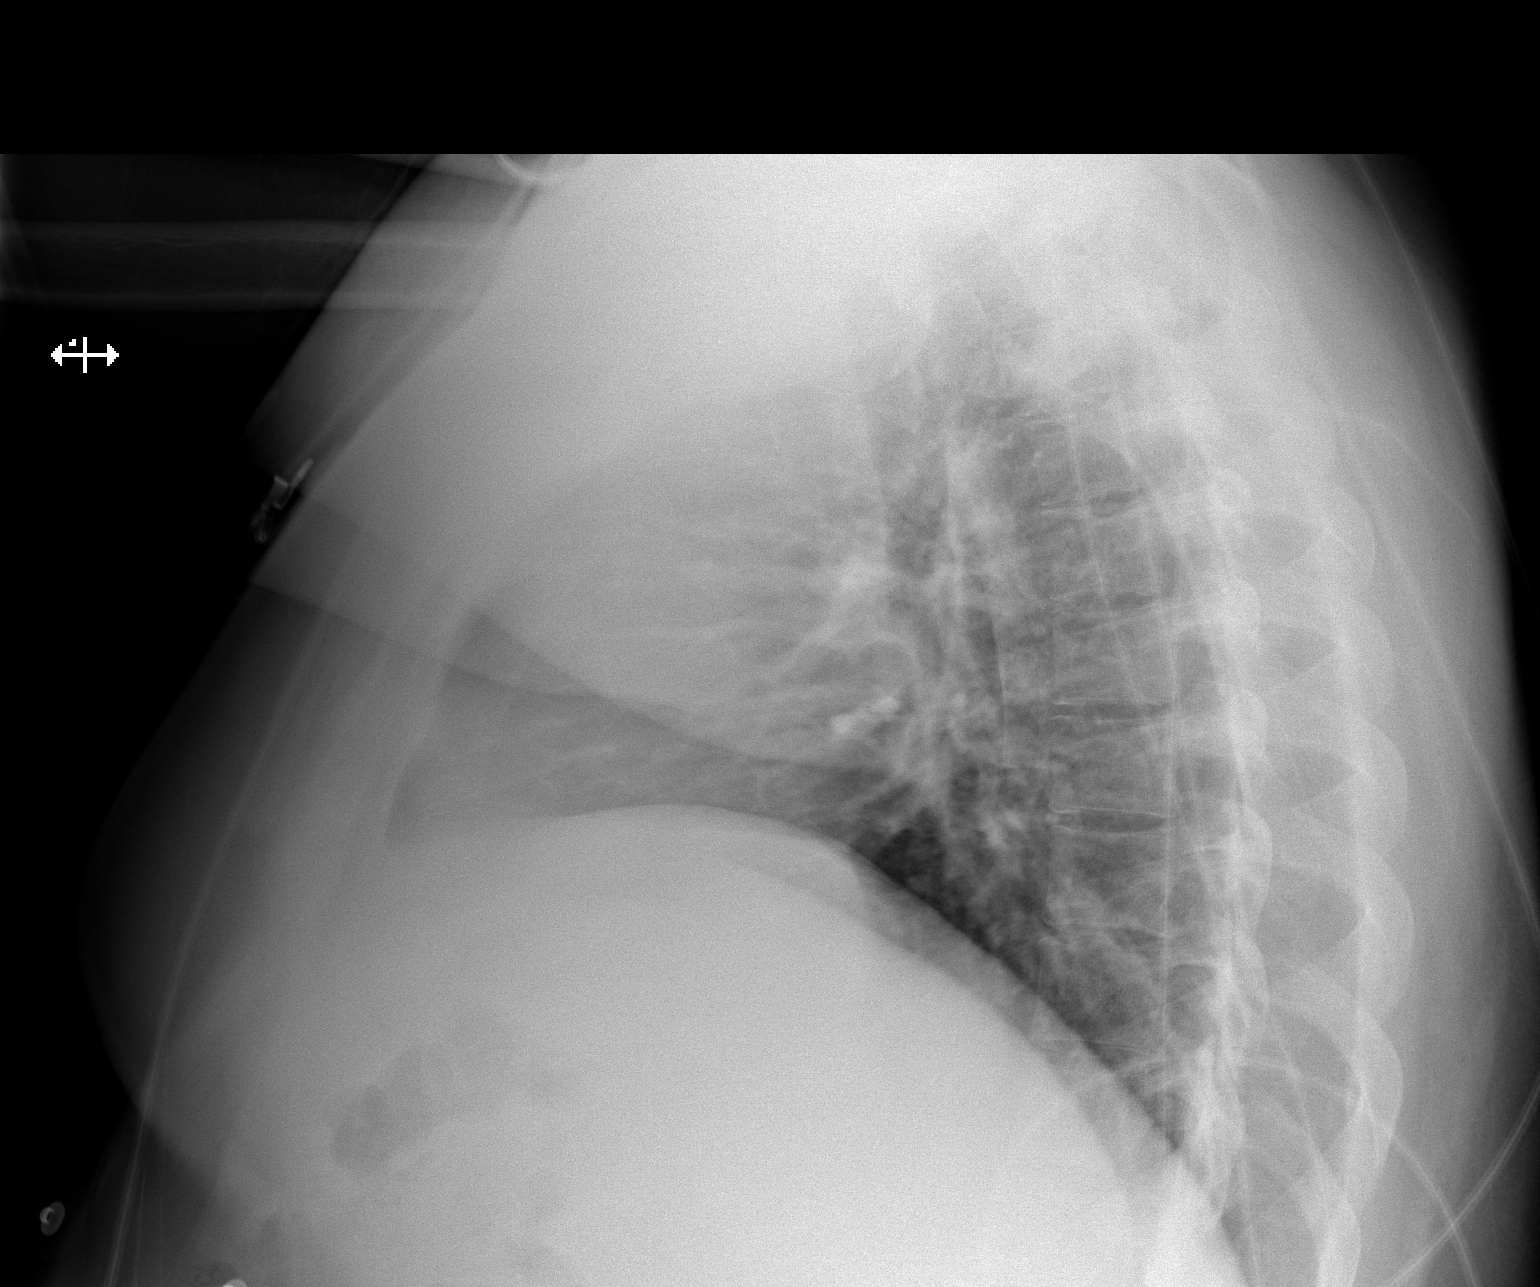

[2 of 2 positions shown; findings below may reference images not displayed]

FINDINGS: Shallow inspiration radiograph. Heart size within normal limits.
Mild ill-defined opacity within the left lung base appreciated on
the PA radiograph only. No appreciable airspace consolidation within
the right lung. No evidence of pleural effusion or pneumothorax. No
acute bony abnormality identified.
IMPRESSION: Shallow inspiration radiograph. Mild ill-defined opacity within the
left lung base, appreciated on the PA radiograph only. This is
favored to reflect atelectasis. However, early pneumonia is
difficult to exclude. Correlate clinically and consider
short-interval radiographic follow-up.

## 2022-11-14 IMAGING — CT CT ABD-PELV W/ CM
2 of 4 series · 16 of 46 positions shown, 18 images · IV contrast (APPLIED)
Comparison: No prior CT of the abdomen or pelvis. Correlation is
made with chest radiograph 02/17/2022.

CLINICAL DATA: Abdominal pain, malaise, fever, emesis

EXAM:
CT ABDOMEN AND PELVIS WITH CONTRAST
TECHNIQUE: Multidetector CT imaging of the abdomen and pelvis was performed
using the standard protocol following bolus administration of
intravenous contrast.

[Series 3: abdomen 5.0 · axial · 0.98mm/px · z∈[+1010,+1475]mm · 13 of 107 slices shown, 15 images]
[im 7/107  soft-tissue]
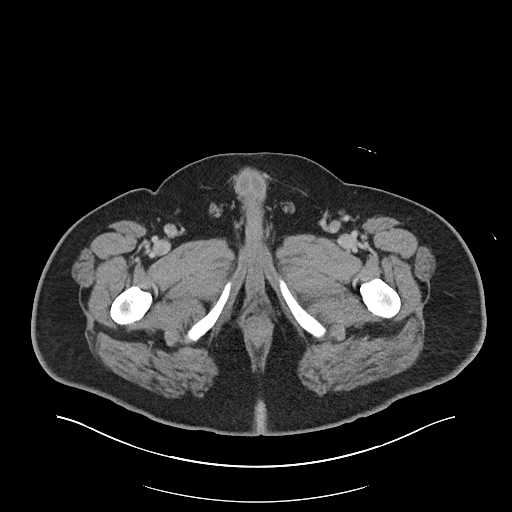
[im 7/107  bone]
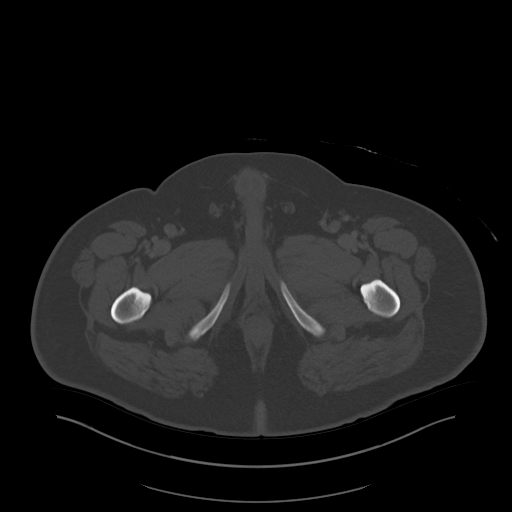
[im 13/107  soft-tissue]
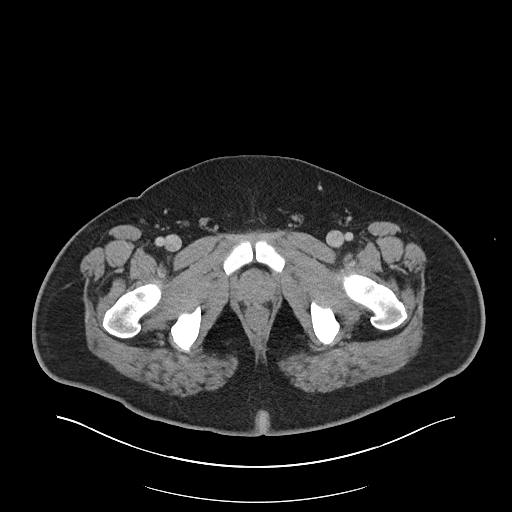
[im 25/107  soft-tissue]
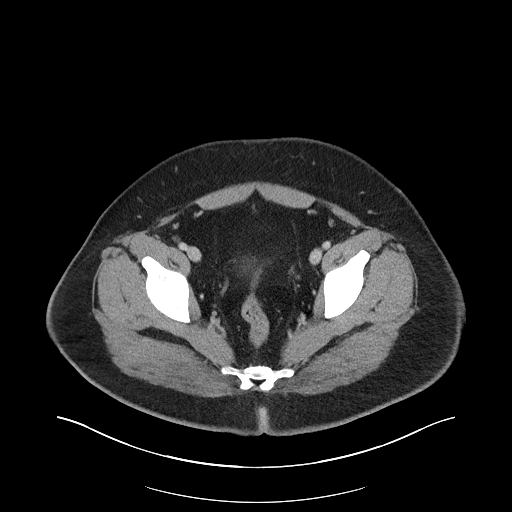
[im 32/107  soft-tissue]
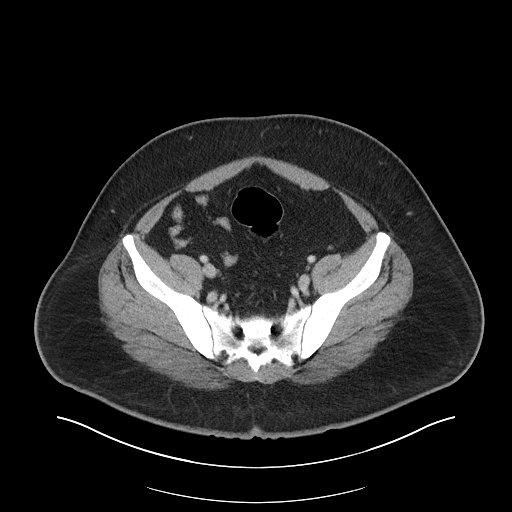
[im 38/107  soft-tissue]
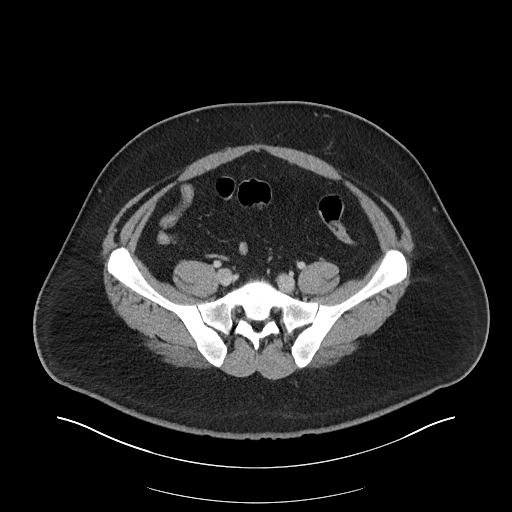
[im 44/107  soft-tissue]
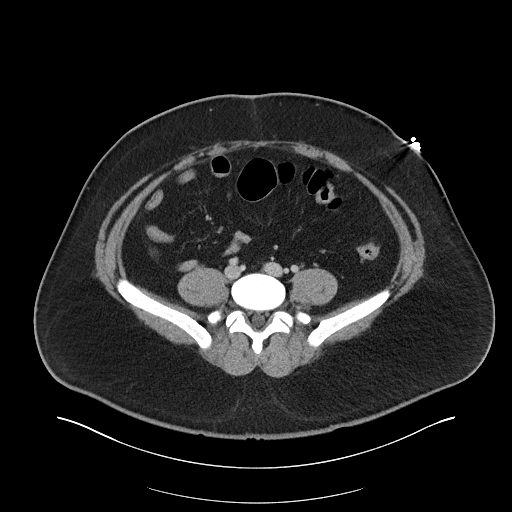
[im 57/107  soft-tissue]
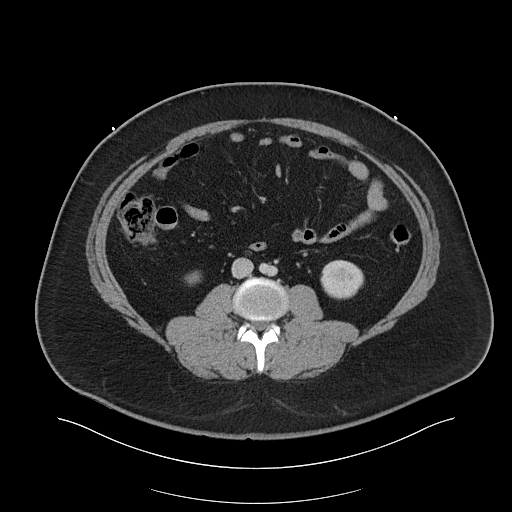
[im 63/107  soft-tissue]
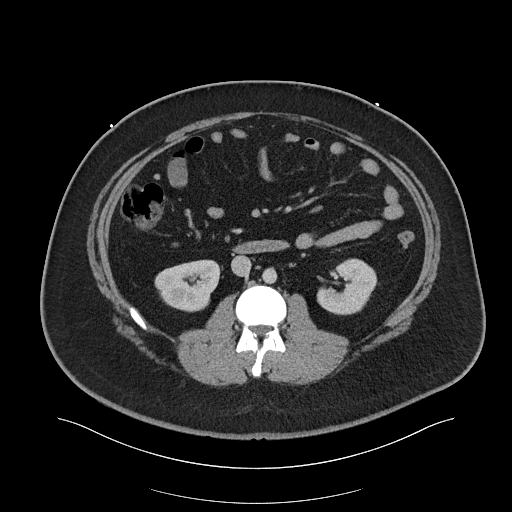
[im 69/107  soft-tissue]
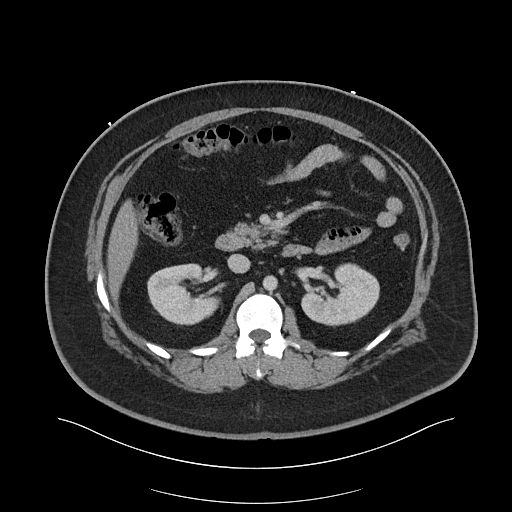
[im 69/107  bone]
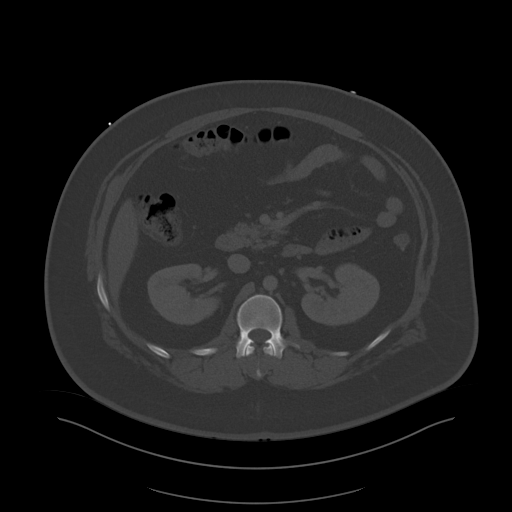
[im 75/107  soft-tissue]
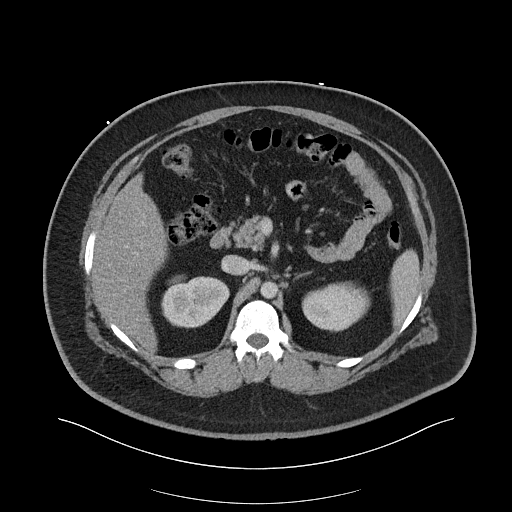
[im 82/107  soft-tissue]
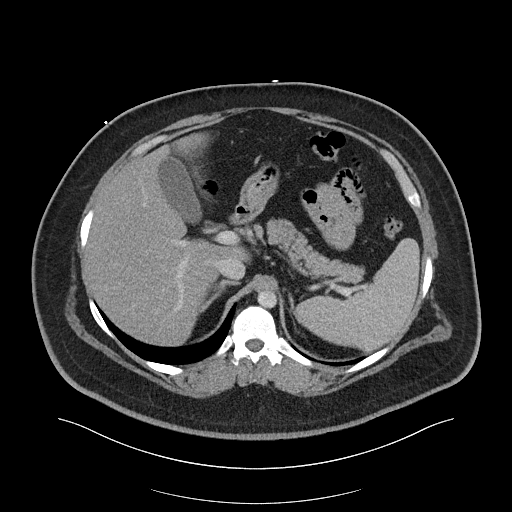
[im 94/107  soft-tissue]
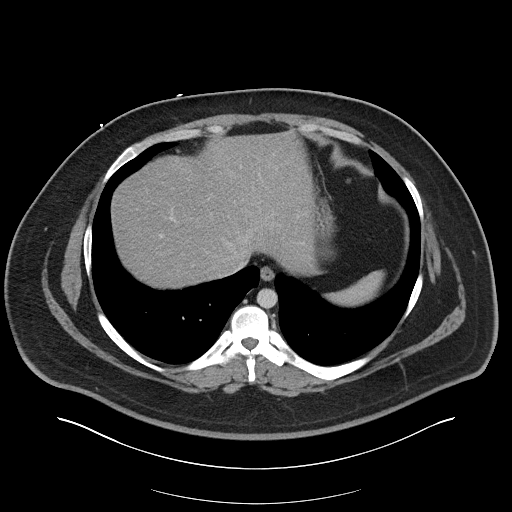
[im 100/107  soft-tissue]
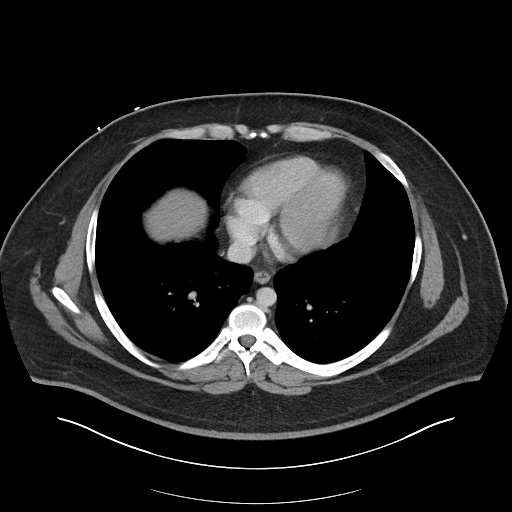

[Series 6: abdomen 3.0 mpr cor · coronal · 0.97mm/px · 3 of 129 slices shown]
[im 43/129  soft-tissue]
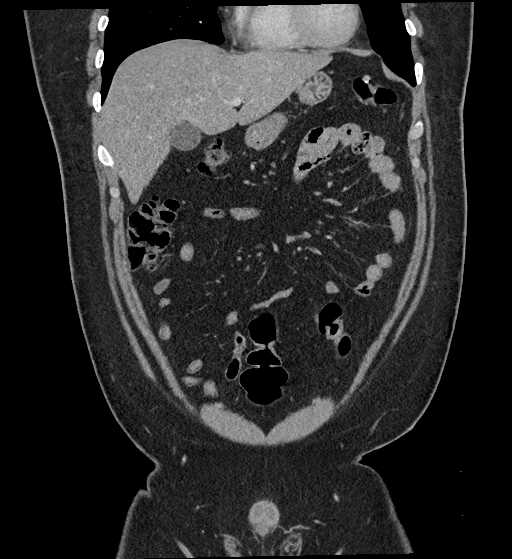
[im 57/129  soft-tissue]
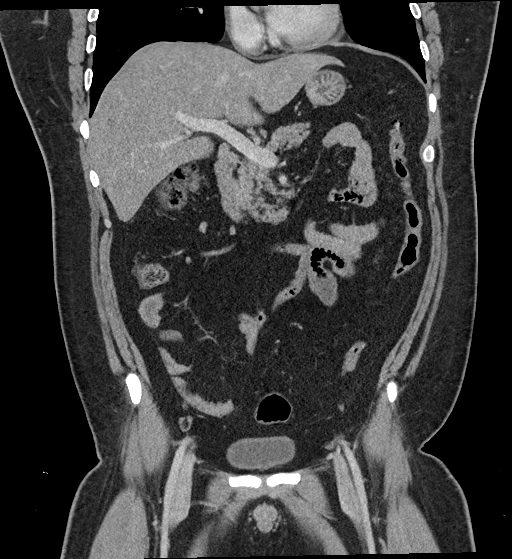
[im 72/129  soft-tissue]
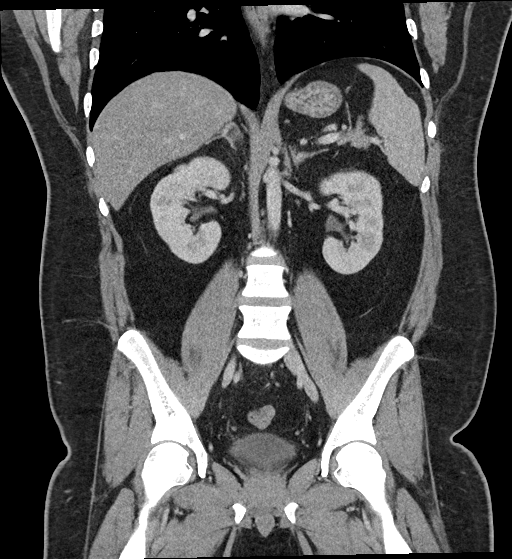

[16 of 46 positions shown; findings below may reference images not displayed]

RADIATION DOSE REDUCTION: This exam was performed according to the
departmental dose-optimization program which includes automated
exposure control, adjustment of the mA and/or kV according to
patient size and/or use of iterative reconstruction technique.

CONTRAST:  100mL OMNIPAQUE IOHEXOL 300 MG/ML  SOLN
FINDINGS: Lower chest: No focal pulmonary opacity or pleural effusion. No
correlate is seen for the suspected opacity noted on the same-day
chest radiograph. No pericardial effusion.

Hepatobiliary: Hepatic steatosis. No focal hepatic lesion. The
hepatic and portal veins are patent. No intra or extrahepatic
biliary ductal dilatation. The gallbladder is unremarkable. No
pericholecystic fluid or wall thickening. No cholelithiasis.

Pancreas: Unremarkable. No pancreatic ductal dilatation or
surrounding inflammatory changes.

Spleen: Normal in size without focal abnormality.

Adrenals/Urinary Tract: The adrenal glands are unremarkable. The
kidneys enhance symmetrically with no hydronephrosis or
nephrolithiasis. The bladder is unremarkable for degree of
distension.

Stomach/Bowel: Stomach is within normal limits. Appendix appears
normal. No evidence of bowel wall thickening, distention, or
inflammatory changes. Diverticulosis without evidence of
diverticulitis.

Vascular/Lymphatic: No significant vascular findings are present. No
enlarged abdominal or pelvic lymph nodes.

Reproductive: Prostate is unremarkable.

Other: No abdominal wall hernia or abnormality. No abdominopelvic
ascites.

Musculoskeletal: Mild S shaped curvature of the thoracolumbar spine.
No acute osseous abnormality. Incidental note is made of
transitional anatomy, with 4 lumbar type vertebral bodies and
sacralization of L5, presuming that the last rib-bearing vertebral
body is T12.
IMPRESSION: 1. No acute process in the abdomen or pelvis.
2. No correlate is seen for the opacity noted in the left lung base
on the same-day radiograph, most likely atelectasis.
3. Incidental note is made of transitional anatomy; presuming the
last rib-bearing vertebral body is T12, there are 4 lumbar-type
vertebral bodies with complete sacralization of L5. Please correlate
with imaging if any intervention is planned.

## 2022-12-15 ENCOUNTER — Ambulatory Visit: Payer: Medicaid Other | Admitting: Critical Care Medicine

## 2023-01-06 ENCOUNTER — Ambulatory Visit: Payer: Medicaid Other | Admitting: Physician Assistant

## 2023-01-27 NOTE — Progress Notes (Unsigned)
Established Patient Office Visit  Subjective   Patient ID: Jack Juarez, male    DOB: January 15, 1996  Age: 27 y.o. MRN: [id number]  No chief complaint on file.   07/15/22 Jack Juarez is a 27 year old male who presents today to follow up on elevated liver enzymes. 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  He last saw them in 03/2022 where he was instructed to follow up as needed as his LFT's were consistently trending down. He was given the Hepatitis A and Hepatitis B vaccine through infectious disease. His mother who accompanies him, reports that she was told to receive the last of the shots through [place].   He was also found to have fatty liver disease and was recommended a mediterranean diet last time we saw him. He has since lost 26 lbs through diet and exercise. Also, his blood pressure is now within normal range compared to an elevated reading at our previous visit.  He states he feels back to his baseline.  He does state some recent burning with urination. Has not noticed any lesions or rashes of his genital area. His mother states she noticed his urine appeared dark.   He has noted some issues with sleeping in the past that seem to be still be occurring. Mom reports he will often go to bed very early following his walk at the park. She believes this may be contributing to his disturbance as he is also a very light sleeper.  He then wakes up in the early AM and is unable to return to sleep. Mom states patient does not snore.   His prior history from myself, his hospitalization and infectious disease is below:  01/28/23     HPI 02/2022 Jack Juarez presents for new patient to establish.  Note patient previously with a another provider but wishes to transfer care.  Patient was hospitalized in February with a viral illness.  He had significant nausea fever myalgias muscle weakness sore throat.  Ultimately labs showed acute CMV infection with  hepatitis.  He did have elevated liver functions.  Patient comes to the office today in improved status.  He is having no real symptoms at this time.  He does have increased weight BMI of 39-1/2.  He does not follow a healthy diet.  He does have autism and is not able to be employed because of this.  He has mental limitations.  He does live with his mother.  Below is a copy of the discharge summary from February.   DOB: 04-27-1996 27 y.o. Juarez: Jack Herter, NP   Date of Admission: 02/22/2022 11:05 AM Date of Discharge: 02/23/22 Attending Physician: Jack Juarez   Discharge Diagnosis: Principal Problem:   Viral infection, unspecified    Disposition and follow-up:   Jack Juarez was discharged from [facility] in Stable condition.  At the hospital follow up visit please address:   1.  Follow-up:             a. Suspected viral illness - possible EBV and CMV, labs pending                   2.  Labs / imaging needed at time of follow-up: liver panel   3.  Pending labs/ test needing follow-up: EBV and CMV   4.  Medication Changes    Hospital Course by problem list:    Suspected viral illness - patient  presented with complaints of fever, nausea, vomiting, diarrhea and cough. Noted to have mildly elevated liver enzymes initially. Evaluated with [place] which was negative for cholelithiasis or cholecystitis. Lipase was negative for pancreatitis. He was started on protonix for his symptoms. He was also noted to have elevated D dimer in ED and was evaluated with CTA which was negative for PE. CXR was obtained due to complaints of cough but also negative. UA and covid panel were negative. BCX were collected and showed no growth 24 hrs. CMV, HIV, and EBV labs were collected. HIV returned negative, CMV and EBV pending.  He was treated symptomatically with IVFs and tylenol. He showed improvement on evaluation the following day and was felt stable for discharge. Given elevation in  LFTs, he was instructed to follow up with Juarez in 2 weeks.      Discharge Subjective: Feels better. Difficulty sleeping. Tolerating PO intake. Had one loose stool overnight.  No complaints at this time.    This patient did have a transition of care nurse visit as noted below. RN Jack Juarez   Juarez completed with patient's mother, Jack Juarez. DPR on file to speak with her  Jack Juarez and she requested Jack Juarez be her son's Juarez.  Date of discharge and from where: 02/23/2022, [facility]  How have you been since you were released from the hospital? She said he is doing much better, but still weak, with some dizziness and nausea. She has been giving him the ondansetron and ibuprofen when needed. She checks his temperature and said he has not had a fever.  Any questions or concerns? No   Items Reviewed: Did the pt receive and understand the discharge instructions provided? Yes  - his mother has to review then.  She said that the patient does not read very well.  Medications obtained and verified? Yes  - he has both medications per his mother and she makes sure to give them to him when needed.  Other? No  Any new allergies since your discharge? No  Dietary orders reviewed? No Do you have support at home? Yes - his mother   [place] and Equipment/Supplies: Were home health services ordered? no If so, what is the name of the agency? N/a  Has the agency set up a time to come to the patient's home? not applicable Were any new equipment or medical supplies ordered?  No What is the name of the medical supply agency? N/a Were you able to get the supplies/equipment? not applicable Do you have any questions related to the use of the equipment or supplies? No   Functional Questionnaire: (I = Independent and D = Dependent) ADLs: independent   No evidence of any immune condition.  He did have fatty liver seen on his abdominal CT.  He has an  S shaped T and lumbar spine noted.  He has been screened for diabetes which is negative.  He does have hyperlipidemia.  No history of hypertension.  He had 1 HPV vaccine in December with the previous provider he does not wish to have further vaccines.  He has had 3 COVID vaccinations.  His COVID test was negative during his previous hospitalization.   03/2022 ID visit: #Elevated Liver enzymes #Likely fatty liver -Suspect pt's symptoms leading to hospitalization in February was 2/2 viral etiology(CMC/EBV). Acute CMC/EBV could lead to GI symptoms and elevated liver enzyme. This is a self limited illness in immunocompetent patents and  his symptoms have resolved. -In regards to elevated liver enzymes, [place] on 2/26 showed evidence of steatosis. Fatty liver ->elevate liver enzymes. Repeat labs on 3/21 showed elevated AST/ALT but plateauing values.  -Pt has intentional 9lb weight loss since last visit Plan: -Cmp today -Hep A and B vaccine today -Continue lifestyle modifications and Juarez follow-up. Discussed increasing fruits/vegetable in diet and decreasing red meat/cheeses.  -Follow-up PRN     Laurice Record, MD  Patient Active Problem List   Diagnosis Date Noted  . Dysuria 07/15/2022  . Back pain 07/15/2022  . Sleep disturbance 07/15/2022  . 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 Obesity with body mass index (BMI) of 35.0 to 39.9 without comorbidity 03/11/2022  . Fatty liver 03/11/2022  . Hyperlipidemia 03/11/2022   Past Medical History:  Diagnosis Date  . Autism    No past surgical history on file. Social History   Tobacco Use  . Smoking status: Never  . Smokeless tobacco: Never  Vaping Use  . Vaping Use: Never used  Substance Use Topics  . Alcohol use: Never  . Drug use: Never   Family History  Problem Relation Age of Onset  . Diabetes Mother   . Clotting disorder Mother   . Hypertension Mother    Allergies  Allergen Reactions  . Bee Venom Anaphylaxis        Review of Systems  Genitourinary:  Positive for dysuria.  Musculoskeletal:  Positive for back pain.  Psychiatric/Behavioral:         Sleep disturbance - awakes mid night, no snoring  All other systems reviewed and are negative.     Objective:     There were no vitals taken for this visit.    Physical Exam Exam conducted with a chaperone present (Chaperone present for GU exam).  Constitutional:      Appearance: He is obese.  HENT:     Head: Normocephalic and atraumatic.     Right Ear: External ear normal.     Left Ear: External ear normal.     Mouth/Throat:     Mouth: Mucous membranes are moist.     Pharynx: Oropharynx is clear.  Cardiovascular:     Rate and Rhythm: Normal rate and regular rhythm.     Pulses: Normal pulses.     Heart sounds: Normal heart sounds.  Pulmonary:     Effort: Pulmonary effort is normal.     Breath sounds: Normal breath sounds.  Abdominal:     General: Bowel sounds are normal.  Genitourinary:    Penis: Normal.      Testes: Normal.  Musculoskeletal:     Comments: Lower back tenderness with palpation of muscle  Negative Costovertebral tenderness   Neurological:     Mental Status: He is alert and oriented to person, place, and time. Mental status is at baseline.  Psychiatric:        Mood and Affect: Mood normal.        Behavior: Behavior normal.     No results found for any visits on 01/28/23.      The ASCVD Risk score (Arnett DK, et al., 2019) failed to calculate for the following reasons:   The 2019 ASCVD risk score is only valid for ages 10 to 45    Assessment & Plan:   Problem List Items Addressed This Visit   None 38 minutes spent extra time needed for patient education No follow-ups on file.    Asencion Noble, MD

## 2023-01-28 ENCOUNTER — Encounter: Payer: Self-pay | Admitting: Critical Care Medicine

## 2023-01-28 ENCOUNTER — Ambulatory Visit: Payer: Medicaid Other | Attending: Critical Care Medicine | Admitting: Critical Care Medicine

## 2023-01-28 VITALS — BP 121/72 | HR 61 | Ht 69.0 in | Wt 207.6 lb

## 2023-01-28 DIAGNOSIS — E669 Obesity, unspecified: Secondary | ICD-10-CM | POA: Diagnosis not present

## 2023-01-28 DIAGNOSIS — F840 Autistic disorder: Secondary | ICD-10-CM | POA: Diagnosis not present

## 2023-01-28 DIAGNOSIS — E785 Hyperlipidemia, unspecified: Secondary | ICD-10-CM | POA: Diagnosis not present

## 2023-01-28 DIAGNOSIS — B251 Cytomegaloviral hepatitis: Secondary | ICD-10-CM

## 2023-01-28 DIAGNOSIS — R809 Proteinuria, unspecified: Secondary | ICD-10-CM | POA: Insufficient documentation

## 2023-01-28 DIAGNOSIS — G479 Sleep disorder, unspecified: Secondary | ICD-10-CM

## 2023-01-28 DIAGNOSIS — K760 Fatty (change of) liver, not elsewhere classified: Secondary | ICD-10-CM | POA: Insufficient documentation

## 2023-01-28 DIAGNOSIS — D72820 Lymphocytosis (symptomatic): Secondary | ICD-10-CM | POA: Diagnosis not present

## 2023-01-28 NOTE — Assessment & Plan Note (Addendum)
This has resolved.

## 2023-01-28 NOTE — Assessment & Plan Note (Signed)
Likely due to CMV in the past we will reassess

## 2023-01-28 NOTE — Assessment & Plan Note (Signed)
Reassess liver function suspect will be normal with weight loss

## 2023-01-28 NOTE — Patient Instructions (Signed)
No changes  GREAT job on lifestyle management  Labs today to check liver function , blood counts and protein in urine  Return Dr Joya Gaskins 1 year

## 2023-01-28 NOTE — Assessment & Plan Note (Signed)
Resolved with weight loss.   

## 2023-01-28 NOTE — Assessment & Plan Note (Signed)
Body mass markedly reduced weight down to 200 pounds  Congratulated patient on his lifestyle medicine approach

## 2023-01-28 NOTE — Assessment & Plan Note (Signed)
Reassess CBC for resolution previously due to CMV infection

## 2023-01-29 LAB — CBC WITH DIFFERENTIAL/PLATELET
Basophils Absolute: 0.1 10*3/uL (ref 0.0–0.2)
Basos: 1 %
EOS (ABSOLUTE): 0.2 10*3/uL (ref 0.0–0.4)
Eos: 2 %
Hematocrit: 47 % (ref 37.5–51.0)
Hemoglobin: 16 g/dL (ref 13.0–17.7)
Immature Grans (Abs): 0 10*3/uL (ref 0.0–0.1)
Immature Granulocytes: 0 %
Lymphocytes Absolute: 4.6 10*3/uL — ABNORMAL HIGH (ref 0.7–3.1)
Lymphs: 57 %
MCH: 28.9 pg (ref 26.6–33.0)
MCHC: 34 g/dL (ref 31.5–35.7)
MCV: 85 fL (ref 79–97)
Monocytes Absolute: 0.4 10*3/uL (ref 0.1–0.9)
Monocytes: 5 %
Neutrophils Absolute: 2.9 10*3/uL (ref 1.4–7.0)
Neutrophils: 35 %
Platelets: 357 10*3/uL (ref 150–450)
RBC: 5.54 x10E6/uL (ref 4.14–5.80)
RDW: 12.6 % (ref 11.6–15.4)
WBC: 8.1 10*3/uL (ref 3.4–10.8)

## 2023-01-29 LAB — URINALYSIS
Bilirubin, UA: NEGATIVE
Glucose, UA: NEGATIVE
Ketones, UA: NEGATIVE
Leukocytes,UA: NEGATIVE
Nitrite, UA: NEGATIVE
Protein,UA: NEGATIVE
RBC, UA: NEGATIVE
Specific Gravity, UA: >=1.03 — AB (ref 1.005–1.030)
Urobilinogen, Ur: 0.2 mg/dL (ref 0.2–1.0)
pH, UA: 6.5 (ref 5.0–7.5)

## 2023-02-01 ENCOUNTER — Telehealth: Payer: Self-pay

## 2023-02-01 NOTE — Telephone Encounter (Signed)
-----   Message from Elsie Stain, MD sent at 02/01/2023  6:41 AM EST ----- Let pt know no more protein in urine, blood counts now normal

## 2023-02-01 NOTE — Progress Notes (Signed)
Let pt know no more protein in urine, blood counts now normal

## 2023-02-01 NOTE — Telephone Encounter (Signed)
Pt mother (on DPR)was called and is aware of results, DOB was confirmed.

## 2023-08-22 ENCOUNTER — Emergency Department (HOSPITAL_COMMUNITY): Payer: MEDICAID

## 2023-08-22 ENCOUNTER — Encounter (HOSPITAL_COMMUNITY): Payer: Self-pay

## 2023-08-22 ENCOUNTER — Other Ambulatory Visit: Payer: Self-pay

## 2023-08-22 ENCOUNTER — Emergency Department (HOSPITAL_COMMUNITY)
Admission: EM | Admit: 2023-08-22 | Discharge: 2023-08-22 | Disposition: A | Discharge status: Home / Self Care | Payer: MEDICAID | Attending: Emergency Medicine | Admitting: Emergency Medicine

## 2023-08-22 DIAGNOSIS — F840 Autistic disorder: Secondary | ICD-10-CM | POA: Diagnosis not present

## 2023-08-22 DIAGNOSIS — R55 Syncope and collapse: Secondary | ICD-10-CM | POA: Diagnosis present

## 2023-08-22 DIAGNOSIS — T675XXA Heat exhaustion, unspecified, initial encounter: Secondary | ICD-10-CM | POA: Insufficient documentation

## 2023-08-22 DIAGNOSIS — Z79899 Other long term (current) drug therapy: Secondary | ICD-10-CM | POA: Insufficient documentation

## 2023-08-22 LAB — URINALYSIS, ROUTINE W REFLEX MICROSCOPIC
Bilirubin Urine: NEGATIVE
Glucose, UA: NEGATIVE mg/dL
Hgb urine dipstick: NEGATIVE
Ketones, ur: NEGATIVE mg/dL
Leukocytes,Ua: NEGATIVE
Nitrite: NEGATIVE
Protein, ur: NEGATIVE mg/dL
Specific Gravity, Urine: 1.012 (ref 1.005–1.030)
pH: 8 (ref 5.0–8.0)

## 2023-08-22 LAB — CBC WITH DIFFERENTIAL/PLATELET
Abs Immature Granulocytes: 0.03 10*3/uL (ref 0.00–0.07)
Basophils Absolute: 0 10*3/uL (ref 0.0–0.1)
Basophils Relative: 0 %
Eosinophils Absolute: 0.1 10*3/uL (ref 0.0–0.5)
Eosinophils Relative: 1 %
HCT: 44.8 % (ref 39.0–52.0)
Hemoglobin: 15.2 g/dL (ref 13.0–17.0)
Immature Granulocytes: 0 %
Lymphocytes Relative: 20 %
Lymphs Abs: 2.4 10*3/uL (ref 0.7–4.0)
MCH: 30.1 pg (ref 26.0–34.0)
MCHC: 33.9 g/dL (ref 30.0–36.0)
MCV: 88.7 fL (ref 80.0–100.0)
Monocytes Absolute: 0.6 10*3/uL (ref 0.1–1.0)
Monocytes Relative: 5 %
Neutro Abs: 8.8 10*3/uL — ABNORMAL HIGH (ref 1.7–7.7)
Neutrophils Relative %: 74 %
Platelets: 251 10*3/uL (ref 150–400)
RBC: 5.05 MIL/uL (ref 4.22–5.81)
RDW: 13 % (ref 11.5–15.5)
WBC: 12 10*3/uL — ABNORMAL HIGH (ref 4.0–10.5)
nRBC: 0 % (ref 0.0–0.2)

## 2023-08-22 LAB — COMPREHENSIVE METABOLIC PANEL
ALT: 21 U/L (ref 0–44)
AST: 19 U/L (ref 15–41)
Albumin: 3.9 g/dL (ref 3.5–5.0)
Alkaline Phosphatase: 49 U/L (ref 38–126)
Anion gap: 10 (ref 5–15)
BUN: 15 mg/dL (ref 6–20)
CO2: 26 mmol/L (ref 22–32)
Calcium: 9.3 mg/dL (ref 8.9–10.3)
Chloride: 103 mmol/L (ref 98–111)
Creatinine, Ser: 0.84 mg/dL (ref 0.61–1.24)
GFR, Estimated: >60 mL/min (ref >60)
Glucose, Bld: 106 mg/dL — ABNORMAL HIGH (ref 70–99)
Potassium: 4 mmol/L (ref 3.5–5.1)
Sodium: 139 mmol/L (ref 135–145)
Total Bilirubin: 0.9 mg/dL (ref 0.3–1.2)
Total Protein: 6.7 g/dL (ref 6.5–8.1)

## 2023-08-22 LAB — RAPID URINE DRUG SCREEN, HOSP PERFORMED
Amphetamines: NOT DETECTED
Barbiturates: NOT DETECTED
Benzodiazepines: NOT DETECTED
Cocaine: NOT DETECTED
Opiates: NOT DETECTED
Tetrahydrocannabinol: NOT DETECTED

## 2023-08-22 LAB — CK: Total CK: 108 U/L (ref 49–397)

## 2023-08-22 LAB — CBG MONITORING, ED: Glucose-Capillary: 104 mg/dL — ABNORMAL HIGH (ref 70–99)

## 2023-08-22 MED ORDER — SODIUM CHLORIDE 0.9 % IV BOLUS
1000.0000 mL | Freq: Once | INTRAVENOUS | Status: AC
  Administered 2023-08-22: 1000 mL via INTRAVENOUS

## 2023-08-22 MED ORDER — SODIUM CHLORIDE 0.9 % IV SOLN: INTRAVENOUS | Status: DC

## 2023-08-22 NOTE — ED Provider Notes (Signed)
Patient signed to me by Dr. Particia Nearing pending results of his workup which was negative.  Suspect the patient became overheated.  Will discharge   Lorre Nick, MD 08/22/23 [phone]

## 2023-08-22 NOTE — ED Provider Notes (Signed)
[place] EMERGENCY DEPARTMENT AT [facility] Provider Note   CSN: [id number] Arrival date & time: 08/22/23  1301     History  Chief Complaint  Patient presents with   Nausea   Near Syncope   Dizziness   Weakness    Jack Juarez is a 27 y.o. male.  Pt is a 27 yo male with pmhx significant for autism.  Pt said he has been outside all day doing work and felt like he was going to pass out.  Pt thought he was drinking enough fluid.  Pt was given ivfs and zofran by EMS.       Home Medications Prior to Admission medications   Not on File      Allergies    Bee venom    Review of Systems   Review of Systems  Neurological:  Positive for weakness.  All other systems reviewed and are negative.   Physical Exam Updated Vital Signs BP 122/70 (BP Location: Left Arm)   Pulse 72   Temp 97.8 F (36.6 C) (Oral)   Resp 14   SpO2 100%  Physical Exam Vitals and nursing note reviewed.  Constitutional:      Appearance: Normal appearance.  HENT:     Head: Normocephalic and atraumatic.     Right Ear: External ear normal.     Left Ear: External ear normal.     Nose: Nose normal.     Mouth/Throat:     Mouth: Mucous membranes are dry.  Eyes:     Extraocular Movements: Extraocular movements intact.     Conjunctiva/sclera: Conjunctivae normal.     Pupils: Pupils are equal, round, and reactive to light.  Cardiovascular:     Rate and Rhythm: Normal rate and regular rhythm.     Pulses: Normal pulses.     Heart sounds: Normal heart sounds.  Pulmonary:     Effort: Pulmonary effort is normal.     Breath sounds: Normal breath sounds.  Abdominal:     General: Abdomen is flat. Bowel sounds are normal.     Palpations: Abdomen is soft.  Musculoskeletal:        General: Normal range of motion.     Cervical back: Normal range of motion and neck supple.  Skin:    General: Skin is warm.     Capillary Refill: Capillary refill takes less than 2 seconds.  Neurological:      General: No focal deficit present.     Mental Status: He is alert and oriented to person, place, and time.  Psychiatric:        Mood and Affect: Mood normal.        Behavior: Behavior normal.     ED Results / Procedures / Treatments   Labs (all labs ordered are listed, but only abnormal results are displayed) Labs Reviewed  CBC WITH DIFFERENTIAL/PLATELET - Abnormal; Notable for the following components:      Result Value   WBC 12.0 (*)    Neutro Abs 8.8 (*)    All other components within normal limits  COMPREHENSIVE METABOLIC PANEL - Abnormal; Notable for the following components:   Glucose, Bld 106 (*)    All other components within normal limits  CBG MONITORING, ED - Abnormal; Notable for the following components:   Glucose-Capillary 104 (*)    All other components within normal limits  CK  URINALYSIS, ROUTINE W REFLEX MICROSCOPIC  RAPID URINE DRUG SCREEN, HOSP PERFORMED    EKG EKG Interpretation  Date/Time:  Sunday August 22 2023 13:11:32 EDT Ventricular Rate:  79 PR Interval:  162 QRS Duration:  143 QT Interval:  387 QTC Calculation: 444 R Axis:   103  Text Interpretation: Sinus rhythm RBBB and LPFB No significant change since last tracing Confirmed by Jacalyn Lefevre [phone]) on 08/22/2023 1:52:10 PM  Radiology DG Chest Port 1 View  Result Date: 08/22/2023 CLINICAL DATA:  Near-syncope EXAM: PORTABLE CHEST 1 VIEW COMPARISON:  02/22/2022 FINDINGS: No consolidation, pneumothorax or effusion. No edema. Normal cardiopericardial silhouette. Overlapping cardiac leads IMPRESSION: No acute cardiopulmonary disease. Electronically Signed   By: Karen Kays M.D.   On: 08/22/2023 14:55    Procedures Procedures    Medications Ordered in ED Medications  sodium chloride 0.9 % bolus 1,000 mL (0 mLs Intravenous Stopped 08/22/23 1405)    And  0.9 %  sodium chloride infusion ( Intravenous New Bag/Given 08/22/23 1500)    ED Course/ Medical Decision Making/ A&P                                  Medical Decision Making Amount and/or Complexity of Data Reviewed Labs: ordered. Radiology: ordered.  Risk Prescription drug management.   This patient presents to the ED for concern of near syncope, this involves an extensive number of treatment options, and is a complaint that carries with it a high risk of complications and morbidity.  The differential diagnosis includes heat exhaustion, electrolyte abn   Co morbidities that complicate the patient evaluation  autism   Additional history obtained:  Additional history obtained from epic chart review External records from outside source obtained and reviewed including EMS report   Lab Tests:  I Ordered, and personally interpreted labs.  The pertinent results include:  cbc nl, cmp nl, ck nl   Imaging Studies ordered:  I ordered imaging studies including cxr  I independently visualized and interpreted imaging which showed No acute cardiopulmonary disease.  I agree with the radiologist interpretation   Cardiac Monitoring:  The patient was maintained on a cardiac monitor.  I personally viewed and interpreted the cardiac monitored which showed an underlying rhythm of: nsr   Medicines ordered and prescription drug management:  I ordered medication including IVFs  for sx  Reevaluation of the patient after these medicines showed that the patient improved I have reviewed the patients home medicines and have made adjustments as needed  Critical Interventions:  IVFs   Problem List / ED Course:  Near syncope:  pt is feeling better after fluids.  Labs nl.     Reevaluation:  After the interventions noted above, I reevaluated the patient and found that they have :improved   Social Determinants of Health:  Lives at home   Dispostion:  After consideration of the diagnostic results and the patients response to treatment, I feel that the patent would benefit from discharge with outpatient f/u.           Final Clinical Impression(s) / ED Diagnoses Final diagnoses:  Heat exhaustion, initial encounter    Rx / DC Orders ED Discharge Orders     None         Jacalyn Lefevre, MD 08/22/23 [phone]

## 2023-08-22 NOTE — ED Notes (Signed)
Discharge instructions discussed with pt. Verbalized understanding. VSS. No questions or concerns regarding discharge  

## 2023-08-22 NOTE — ED Triage Notes (Addendum)
Patient BIB [facility] from home, outside doing yard work had a near syncopal episode in a shaded area. Neg orthostatic.. C/O dizziness, pale, nausea, and generalized weakness worsen with sitting and standing. IVF. Zofran 4mg . 20LAC.

## 2023-10-20 ENCOUNTER — Ambulatory Visit: Payer: MEDICAID | Admitting: Nurse Practitioner

## 2023-11-02 ENCOUNTER — Other Ambulatory Visit: Payer: Self-pay

## 2023-11-02 ENCOUNTER — Ambulatory Visit: Payer: MEDICAID | Attending: Nurse Practitioner | Admitting: Nurse Practitioner

## 2023-11-02 ENCOUNTER — Encounter: Payer: Self-pay | Admitting: Nurse Practitioner

## 2023-11-02 VITALS — BP 130/79 | HR 62 | Ht 69.0 in | Wt 201.2 lb

## 2023-11-02 DIAGNOSIS — Z91030 Bee allergy status: Secondary | ICD-10-CM | POA: Diagnosis not present

## 2023-11-02 DIAGNOSIS — R55 Syncope and collapse: Secondary | ICD-10-CM | POA: Diagnosis not present

## 2023-11-02 DIAGNOSIS — R42 Dizziness and giddiness: Secondary | ICD-10-CM

## 2023-11-02 MED ORDER — EPINEPHRINE 0.3 MG/0.3ML IJ SOAJ
0.3000 mg | INTRAMUSCULAR | 99 refills | Status: DC | PRN
  Filled 2023-11-02: qty 2, fill #0
  Filled 2023-11-10: qty 2, 14d supply, fill #0
  Filled 2023-11-11 – 2023-11-19 (×3): qty 2, 30d supply, fill #0

## 2023-11-02 NOTE — Progress Notes (Signed)
Assessment & Plan:  Jack Juarez was seen today for loss of consciousness.  Diagnoses and all orders for this visit:  Postural dizziness with presyncope -     ECHOCARDIOGRAM COMPLETE; Future -     Thyroid Panel With TSH  Allergy to honey bee venom -     EPINEPHrine 0.3 mg/0.3 mL IJ SOAJ injection; Inject 0.3 mg into the muscle as needed for anaphylaxis.    Patient has been counseled on age-appropriate routine health concerns for screening and prevention. These are reviewed and up-to-date. Referrals have been placed accordingly. Immunizations are up-to-date or declined.    Subjective:   Chief Complaint  Patient presents with   Loss of Consciousness    Patient states the he fainted 3 times and has been experiencing weakness through his whole body     Jack Juarez 27 y.o. male presents to office today with complaints of presyncope/fainting and weakness.  He is a patient of Dr. Delford Field  PMH: Autism  He was seen in the ED on 08-22-2023 and diagnosed with heat exhaustion. At that time he told the provider he had been outside all day and felt like he was going to pass out. Today he tells me he has had 3 episodes of feeling as he was going to pass out. Associated symptoms: dizziness, shakiness and weakness. The episodes lasted up to 15 minutes and resolved on their own. He did not lose consciousness.  BP Readings from Last 3 Encounters:  11/02/23 130/79  08/22/23 132/60  01/28/23 121/72    Wt Readings from Last 3 Encounters:  11/02/23 201 lb 3.2 oz (91.3 kg)  08/22/23 207 lb 10.8 oz (94.2 kg)  01/28/23 207 lb 9.6 oz (94.2 kg)     Review of Systems  Constitutional:  Negative for fever, malaise/fatigue and weight loss.  HENT: Negative.  Negative for nosebleeds.   Eyes: Negative.  Negative for blurred vision, double vision and photophobia.  Respiratory: Negative.  Negative for cough and shortness of breath.   Cardiovascular: Negative.  Negative for chest pain, palpitations and leg  swelling.  Gastrointestinal: Negative.  Negative for heartburn, nausea and vomiting.  Musculoskeletal: Negative.  Negative for myalgias.  Neurological:  Positive for dizziness and weakness. Negative for focal weakness, seizures and headaches.  Psychiatric/Behavioral: Negative.  Negative for suicidal ideas.     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 No pertinent surgical history.  Family History  Problem Relation Age of Onset   Diabetes Mother    Clotting disorder Mother    Hypertension Mother     Social History Reviewed with no changes to be made today.   No outpatient medications prior to visit.   No facility-administered medications prior to visit.    Allergies  Allergen Reactions   Bee Venom Anaphylaxis       Objective:    BP 130/79 (BP Location: Left Arm, Patient Position: Sitting, Cuff Size: Normal)   Pulse 62   Ht 5\' 9"  (1.753 m)   Wt 201 lb 3.2 oz (91.3 kg)   SpO2 99%   BMI 29.71 kg/m  Wt Readings from Last 3 Encounters:  11/02/23 201 lb 3.2 oz (91.3 kg)  08/22/23 207 lb 10.8 oz (94.2 kg)  01/28/23 207 lb 9.6 oz (94.2 kg)    Physical Exam Vitals and nursing note reviewed.  Constitutional:      Appearance: He  is well-developed.  HENT:     Head: Normocephalic and atraumatic.  Cardiovascular:     Rate and Rhythm: Normal rate and regular rhythm.     Heart sounds: Normal heart sounds. No murmur heard.    No friction rub. No gallop.  Pulmonary:     Effort: Pulmonary effort is normal. No tachypnea or respiratory distress.     Breath sounds: Normal breath sounds. No decreased breath sounds, wheezing, rhonchi or rales.  Chest:     Chest wall: No tenderness.  Abdominal:     General: Bowel sounds are normal.     Palpations: Abdomen is soft.  Musculoskeletal:        General: Normal range of motion.     Cervical back: Normal range of  motion.  Skin:    General: Skin is warm and dry.  Neurological:     Mental Status: He is alert and oriented to person, place, and time.     Coordination: Coordination normal.  Psychiatric:        Behavior: Behavior normal. Behavior is cooperative.        Thought Content: Thought content normal.        Judgment: Judgment normal.          Patient has been counseled extensively about nutrition and exercise as well as the importance of adherence with medications and regular follow-up. The patient was given clear instructions to go to ER or return to medical center if symptoms don't improve, worsen or new problems develop. The patient verbalized understanding.   Follow-up: Return if symptoms worsen or fail to improve.   Claiborne Rigg, FNP-BC [facility] [place], [place] [phone]   11/14/2023, 9:40 PM

## 2023-11-04 LAB — THYROID PANEL WITH TSH
Free Thyroxine Index: 2.3 (ref 1.2–4.9)
T3 Uptake Ratio: 29 % (ref 24–39)
T4, Total: 8 ug/dL (ref 4.5–12.0)
TSH: 1.58 u[IU]/mL (ref 0.450–4.500)

## 2023-11-10 ENCOUNTER — Other Ambulatory Visit: Payer: Self-pay

## 2023-11-11 ENCOUNTER — Other Ambulatory Visit: Payer: Self-pay

## 2023-11-11 ENCOUNTER — Other Ambulatory Visit (HOSPITAL_COMMUNITY): Payer: Self-pay

## 2023-11-14 ENCOUNTER — Encounter: Payer: Self-pay | Admitting: Nurse Practitioner

## 2023-11-19 ENCOUNTER — Other Ambulatory Visit (HOSPITAL_COMMUNITY): Payer: Self-pay

## 2023-11-19 ENCOUNTER — Other Ambulatory Visit: Payer: Self-pay

## 2023-11-22 ENCOUNTER — Other Ambulatory Visit (HOSPITAL_COMMUNITY): Payer: Self-pay

## 2023-11-24 ENCOUNTER — Other Ambulatory Visit (HOSPITAL_COMMUNITY): Payer: Self-pay

## 2023-11-24 ENCOUNTER — Other Ambulatory Visit: Payer: Self-pay

## 2023-12-10 ENCOUNTER — Ambulatory Visit (HOSPITAL_COMMUNITY): Admission: RE | Admit: 2023-12-10 | Payer: MEDICAID | Source: Ambulatory Visit

## 2023-12-31 ENCOUNTER — Ambulatory Visit (HOSPITAL_COMMUNITY): Admission: RE | Admit: 2023-12-31 | Payer: MEDICAID | Source: Ambulatory Visit

## 2024-01-17 ENCOUNTER — Ambulatory Visit (HOSPITAL_COMMUNITY)
Admission: RE | Admit: 2024-01-17 | Discharge: 2024-01-17 | Disposition: A | Discharge status: Home / Self Care | Payer: MEDICAID | Source: Ambulatory Visit | Attending: Nurse Practitioner

## 2024-01-17 DIAGNOSIS — R42 Dizziness and giddiness: Secondary | ICD-10-CM | POA: Diagnosis not present

## 2024-01-17 DIAGNOSIS — R55 Syncope and collapse: Secondary | ICD-10-CM | POA: Diagnosis present

## 2024-01-17 LAB — ECHOCARDIOGRAM COMPLETE
AR max vel: 2.45 cm2
AV Area VTI: 2.64 cm2
AV Area mean vel: 2.4 cm2
AV Mean grad: 8 mm[Hg]
AV Peak grad: 12.4 mm[Hg]
Ao pk vel: 1.76 m/s
Area-P 1/2: 6.12 cm2
S' Lateral: 2.5 cm

## 2024-09-22 ENCOUNTER — Telehealth: Payer: Self-pay | Admitting: *Deleted

## 2024-09-22 NOTE — Telephone Encounter (Signed)
 Copied from CRM [id number]. Topic: Clinical - Request for Lab/Test Order >> Sep 21, 2024 12:44 PM Dedra B wrote: Reason for CRM: Pt is requesting routine lab work same day of his appt 10/9.

## 2024-10-02 NOTE — Progress Notes (Unsigned)
 Established Patient Office Visit  Subjective   Patient ID: Jack Juarez, male    DOB: 06-14-1996  Age: 28 y.o. MRN: [id number]  No chief complaint on file.   07/15/22 Jack Juarez is a 28 year old male who presents today to follow up on elevated liver enzymes. 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  He last saw them in 03/2022 where he was instructed to follow up as needed as his LFT's were consistently trending down. He was given the Hepatitis A and Hepatitis B vaccine through infectious disease. His mother who accompanies him, reports that she was told to receive the last of the shots through us .   He was also found to have fatty liver disease and was recommended a mediterranean diet last time we saw him. He has since lost 26 lbs through diet and exercise. Also, his blood pressure is now within normal range compared to an elevated reading at our previous visit.  He states he feels back to his baseline.  He does state some recent burning with urination. Has not noticed any lesions or rashes of his genital area. His mother states she noticed his urine appeared dark.   He has noted some issues with sleeping in the past that seem to be still be occurring. Mom reports he will often go to bed very early following his walk at the park. She believes this may be contributing to his disturbance as he is also a very light sleeper.  He then wakes up in the early AM and is unable to return to sleep. Mom states patient does not snore.   His prior history from myself, his hospitalization and infectious disease is below:  01/28/23 Since the last visit the patient has lost 40 pounds in weight and been pursuing a lifestyle medicine approach with success.  He has no complaints this visit.  He needs recheck and labs.  10/05/2024 Pcp f/u       HPI 02/2022 Jack Juarez presents for new patient to establish.  Note patient previously with a another  provider but wishes to transfer care.  Patient was hospitalized in February with a viral illness.  He had significant nausea fever myalgias muscle weakness sore throat.  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  He did have elevated liver functions.  Patient comes to the office today in improved status.  He is having no real symptoms at this time.  He does have increased weight BMI of 39-1/2.  He does not follow a healthy diet.  He does have autism and is not able to be employed because of this.  He has mental limitations.  He does live with his mother.  Below is a copy of the discharge summary from February.   DOB: April 09, 1996 28 y.o. PCP: Lorren Greig PARAS, NP   Date of Admission: 02/22/2022 11:05 AM Date of Discharge: 02/23/22 Attending Physician: Dr. Forest   Discharge Diagnosis: Principal Problem:   Viral infection, unspecified    Disposition and follow-up:   Jack Juarez was discharged from [facility] in Stable condition.  At the hospital follow up visit please address:   1.  Follow-up:             a. Suspected viral illness - possible EBV and CMV, labs pending                   2.  Labs / imaging needed  at time of follow-up: liver panel   3.  Pending labs/ test needing follow-up: EBV and CMV   4.  Medication Changes    Hospital Course by problem list:    Suspected viral illness - patient presented with complaints of fever, nausea, vomiting, diarrhea and cough. Noted to have mildly elevated liver enzymes initially. Evaluated with US  which was negative for cholelithiasis or cholecystitis. Lipase was negative for pancreatitis. He was started on protonix  for his symptoms. He was also noted to have elevated D dimer in ED and was evaluated with CTA which was negative for PE. CXR was obtained due to complaints of cough but also negative. UA and covid panel were negative. BCX were collected and showed no growth 24 hrs. CMV, HIV, and EBV labs were  collected. HIV returned negative, CMV and EBV pending.  He was treated symptomatically with IVFs and tylenol . He showed improvement on evaluation the following day and was felt stable for discharge. Given elevation in LFTs, he was instructed to follow up with PCP in 2 weeks.      Discharge Subjective: Feels better. Difficulty sleeping. Tolerating PO intake. Had one loose stool overnight.  No complaints at this time.    This patient did have a transition of care nurse visit as noted below. RN Toc Transition Care Management Follow-up Telephone Call   Call completed with patient's mother, Jack Juarez. DPR on file to speak with her  Dr Brien is her PCP and she requested Dr Brien be her son's PCP.  Date of discharge and from where: 02/23/2022, [facility]  How have you been since you were released from the hospital? She said he is doing much better, but still weak, with some dizziness and nausea. She has been giving him the ondansetron  and ibuprofen when needed. She checks his temperature and said he has not had a fever.  Any questions or concerns? No   Items Reviewed: Did the pt receive and understand the discharge instructions provided? Yes  - his mother has to review then.  She said that the patient does not read very well.  Medications obtained and verified? Yes  - he has both medications per his mother and she makes sure to give them to him when needed.  Other? No  Any new allergies since your discharge? No  Dietary orders reviewed? No Do you have support at home? Yes - his mother   Home Care and Equipment/Supplies: Were home health services ordered? no If so, what is the name of the agency? N/a  Has the agency set up a time to come to the patient's home? not applicable Were any new equipment or medical supplies ordered?  No What is the name of the medical supply agency? N/a Were you able to get the supplies/equipment? not applicable Do you have any questions related to the use of  the equipment or supplies? No   Functional Questionnaire: (I = Independent and D = Dependent) ADLs: independent   No evidence of any immune condition.  He did have fatty liver seen on his abdominal CT.  He has an S shaped T and lumbar spine noted.  He has been screened for diabetes which is negative.  He does have hyperlipidemia.  No history of hypertension.  He had 1 HPV vaccine in December with the previous provider he does not wish to have further vaccines.  He has had 3 COVID vaccinations.  His COVID test was negative during his previous hospitalization.   03/2022 ID  visit: #Elevated Liver enzymes #Likely fatty liver -Suspect pt's symptoms leading to hospitalization in February was 2/2 viral etiology(CMC/EBV). Acute CMC/EBV could lead to GI symptoms and elevated liver enzyme. 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 -In regards to elevated liver enzymes, US  on 2/26 showed evidence of steatosis. Fatty liver ->elevate liver enzymes. Repeat labs on 3/21 showed elevated AST/ALT but plateauing values.  -Pt has intentional 9lb weight loss since last visit Plan: -Cmp today -Hep A and B vaccine today -Continue lifestyle modifications and PCP follow-up. Discussed increasing fruits/vegetable in diet and decreasing red meat/cheeses.  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 Social History   Tobacco Use   Smoking status: Never   Smokeless tobacco: Never  Vaping Use   Vaping status: Never Used  Substance Use Topics   Alcohol use: Never   Drug use: Never   Family History   Problem Relation Age of Onset   Diabetes Mother    Clotting disorder Mother    Hypertension Mother    Allergies  Allergen Reactions   Bee Venom Anaphylaxis       Review of Systems  Constitutional:  Negative for chills, diaphoresis, fever, malaise/fatigue and weight loss.  HENT:  Negative for congestion, ear discharge, ear pain, hearing loss, nosebleeds, sore throat and tinnitus.   Eyes:  Negative for blurred vision, photophobia and redness.  Respiratory:  Negative for cough, hemoptysis, sputum production, shortness of breath, wheezing and stridor.   Cardiovascular:  Negative for chest pain, palpitations, orthopnea, claudication, leg swelling and PND.  Gastrointestinal:  Negative for abdominal pain, blood in stool, constipation, diarrhea, heartburn, nausea and vomiting.  Genitourinary:  Negative for dysuria, flank pain, frequency, hematuria and urgency.  Musculoskeletal:  Negative for back pain, falls, joint pain, myalgias and neck pain.  Skin:  Negative for itching and rash.  Neurological:  Negative for dizziness, tingling, tremors, sensory change, speech change, focal weakness, seizures, loss of consciousness, weakness and headaches.  Endo/Heme/Allergies:  Negative for environmental allergies and polydipsia. Does not bruise/bleed easily.  Psychiatric/Behavioral:  Negative for depression, memory loss, substance abuse and suicidal ideas. The patient is not nervous/anxious and does not have insomnia.   All other systems reviewed and are negative.     Objective:     There were no vitals taken for this visit.    Physical Exam Constitutional:      Appearance: He is obese.     Comments: Marked reduction in weight  HENT:     Head: Normocephalic and atraumatic.     Right Ear: Tympanic membrane normal.     Left Ear: Tympanic membrane normal.     Mouth/Throat:     Mouth: Mucous membranes are moist.     Pharynx: Oropharynx is clear.  Cardiovascular:     Rate and Rhythm: Normal  rate and regular rhythm.     Pulses: Normal pulses.     Heart sounds: Normal heart sounds.  Pulmonary:     Effort: Pulmonary effort is normal.     Breath sounds: Normal breath sounds.  Abdominal:  General: Bowel sounds are normal.  Genitourinary:    Penis: Normal.      Testes: Normal.  Neurological:     Mental Status: He is alert and oriented to person, place, and time. Mental status is at baseline.  Psychiatric:        Mood and Affect: Mood normal.        Behavior: Behavior normal.      No results found for any visits on 10/05/24.      The ASCVD Risk score (Arnett DK, et al., 2019) failed to calculate for the following reasons:   The 2019 ASCVD risk score is only valid for ages 61 to 8    Assessment & Plan:   Problem List Items Addressed This Visit   None   No follow-ups on file.    Belvie Silvan, MD

## 2024-10-05 ENCOUNTER — Encounter: Payer: Self-pay | Admitting: Critical Care Medicine

## 2024-10-05 ENCOUNTER — Ambulatory Visit: Payer: MEDICAID | Attending: Critical Care Medicine | Admitting: Critical Care Medicine

## 2024-10-05 ENCOUNTER — Other Ambulatory Visit: Payer: Self-pay

## 2024-10-05 VITALS — BP 139/80 | HR 82 | Resp 19 | Ht 69.0 in | Wt 205.0 lb

## 2024-10-05 DIAGNOSIS — D72820 Lymphocytosis (symptomatic): Secondary | ICD-10-CM | POA: Diagnosis not present

## 2024-10-05 DIAGNOSIS — K760 Fatty (change of) liver, not elsewhere classified: Secondary | ICD-10-CM | POA: Diagnosis not present

## 2024-10-05 DIAGNOSIS — E782 Mixed hyperlipidemia: Secondary | ICD-10-CM | POA: Diagnosis not present

## 2024-10-05 DIAGNOSIS — Z91030 Bee allergy status: Secondary | ICD-10-CM | POA: Insufficient documentation

## 2024-10-05 MED ORDER — EPINEPHRINE 0.3 MG/0.3ML IJ SOAJ
0.3000 mg | INTRAMUSCULAR | 99 refills | Status: AC | PRN
  Filled 2024-10-05: qty 2, 10d supply, fill #0

## 2024-10-05 NOTE — Assessment & Plan Note (Signed)
 Repeat liver functions and gave patient a lifestyle medicine approach no imaging needed  The following Lifestyle Medicine recommendations according to American College of Lifestyle Medicine [facility]) were discussed and offered to patient who agrees to start the journey:  A. Whole Foods, Plant-based plate comprising of fruits and vegetables, plant-based proteins, whole-grain carbohydrates was discussed in detail with the patient.   A list for source of those nutrients were also provided to the patient.  Patient will use only water or unsweetened tea for hydration. B.  The need to stay away from risky substances including alcohol, smoking; obtaining 7 to 9 hours of restorative sleep, at least 150 minutes of moderate intensity exercise weekly, the importance of healthy social connections,  and stress reduction techniques were discussed. C.  A full color page of  Calorie density of various food groups per pound showing examples of each food groups was provided to the patient.

## 2024-10-05 NOTE — Assessment & Plan Note (Signed)
Reassess lipids  

## 2024-10-05 NOTE — Assessment & Plan Note (Signed)
Reassess blood count 

## 2024-10-05 NOTE — Assessment & Plan Note (Signed)
 Given a refill on his EpiPen 

## 2024-10-05 NOTE — Patient Instructions (Signed)
 EpiPen  was refilled to the pharmacy downstairs  Labs today will be to check your liver function and your blood counts and cholesterol  Return in 1 year

## 2024-10-06 ENCOUNTER — Ambulatory Visit: Payer: Self-pay | Admitting: Critical Care Medicine

## 2024-10-06 LAB — CBC WITH DIFFERENTIAL/PLATELET
Basophils Absolute: 0.1 x10E3/uL (ref 0.0–0.2)
Basos: 1 %
EOS (ABSOLUTE): 0.2 x10E3/uL (ref 0.0–0.4)
Eos: 3 %
Hematocrit: 51.9 % — ABNORMAL HIGH (ref 37.5–51.0)
Hemoglobin: 17.1 g/dL (ref 13.0–17.7)
Immature Grans (Abs): 0 x10E3/uL (ref 0.0–0.1)
Immature Granulocytes: 0 %
Lymphocytes Absolute: 3.5 x10E3/uL — ABNORMAL HIGH (ref 0.7–3.1)
Lymphs: 43 %
MCH: 29.8 pg (ref 26.6–33.0)
MCHC: 32.9 g/dL (ref 31.5–35.7)
MCV: 90 fL (ref 79–97)
Monocytes Absolute: 0.6 x10E3/uL (ref 0.1–0.9)
Monocytes: 7 %
Neutrophils Absolute: 3.7 x10E3/uL (ref 1.4–7.0)
Neutrophils: 46 %
Platelets: 289 x10E3/uL (ref 150–450)
RBC: 5.74 x10E6/uL (ref 4.14–5.80)
RDW: 12.5 % (ref 11.6–15.4)
WBC: 8.1 x10E3/uL (ref 3.4–10.8)

## 2024-10-06 LAB — LIPID PANEL
Chol/HDL Ratio: 4.2 ratio (ref 0.0–5.0)
Cholesterol, Total: 188 mg/dL (ref 100–199)
HDL: 45 mg/dL (ref >39)
LDL Chol Calc (NIH): 118 mg/dL — ABNORMAL HIGH (ref 0–99)
Triglycerides: 138 mg/dL (ref 0–149)
VLDL Cholesterol Cal: 25 mg/dL (ref 5–40)

## 2024-10-06 LAB — CMP14+EGFR
ALT: 26 IU/L (ref 0–44)
AST: 19 IU/L (ref 0–40)
Albumin: 4.5 g/dL (ref 4.3–5.2)
Alkaline Phosphatase: 65 IU/L (ref 47–123)
BUN/Creatinine Ratio: 16 (ref 9–20)
BUN: 11 mg/dL (ref 6–20)
Bilirubin Total: 0.5 mg/dL (ref 0.0–1.2)
CO2: 23 mmol/L (ref 20–29)
Calcium: 9.7 mg/dL (ref 8.7–10.2)
Chloride: 101 mmol/L (ref 96–106)
Creatinine, Ser: 0.68 mg/dL — ABNORMAL LOW (ref 0.76–1.27)
Globulin, Total: 2.6 g/dL (ref 1.5–4.5)
Glucose: 89 mg/dL (ref 70–99)
Potassium: 5 mmol/L (ref 3.5–5.2)
Sodium: 140 mmol/L (ref 134–144)
Total Protein: 7.1 g/dL (ref 6.0–8.5)
eGFR: 129 mL/min/1.73 (ref >59)

## 2024-10-06 NOTE — Progress Notes (Signed)
 Let patient know liver function is normal liver is no longer full of fat it is returned to normal thanks to his excellent weight loss control Kidneys are normal blood counts normal cholesterol mildly elevated continue to follow the healthy diet as prescribed l
